# Patient Record
Sex: Male | Born: 1965
Health system: Southern US, Community
[De-identification: ages and names within clinical notes are randomized; demographics above are authoritative.]

## PROBLEM LIST (undated history)

## (undated) DIAGNOSIS — S81802A Unspecified open wound, left lower leg, initial encounter: Secondary | ICD-10-CM

## (undated) DIAGNOSIS — G4730 Sleep apnea, unspecified: Secondary | ICD-10-CM

## (undated) DIAGNOSIS — K219 Gastro-esophageal reflux disease without esophagitis: Secondary | ICD-10-CM

## (undated) DIAGNOSIS — S2239XA Fracture of one rib, unspecified side, initial encounter for closed fracture: Secondary | ICD-10-CM

## (undated) DIAGNOSIS — Z889 Allergy status to unspecified drugs, medicaments and biological substances status: Secondary | ICD-10-CM

## (undated) DIAGNOSIS — N400 Enlarged prostate without lower urinary tract symptoms: Secondary | ICD-10-CM

## (undated) HISTORY — PX: APPENDECTOMY: SHX54

## (undated) HISTORY — DX: Sleep apnea, unspecified: G47.30

---

## 1998-05-30 ENCOUNTER — Other Ambulatory Visit: Admission: RE | Admit: 1998-05-30 | Discharge: 1998-05-30 | Payer: Self-pay | Admitting: Obstetrics and Gynecology

## 1998-05-31 ENCOUNTER — Ambulatory Visit: Admission: RE | Admit: 1998-05-31 | Discharge: 1998-05-31 | Payer: Self-pay | Admitting: Otolaryngology

## 2007-11-15 ENCOUNTER — Emergency Department (HOSPITAL_COMMUNITY): Admission: EM | Admit: 2007-11-15 | Discharge: 2007-11-15 | Payer: Self-pay | Admitting: Emergency Medicine

## 2008-10-16 ENCOUNTER — Ambulatory Visit: Payer: Self-pay | Admitting: Diagnostic Radiology

## 2008-10-16 ENCOUNTER — Emergency Department (HOSPITAL_BASED_OUTPATIENT_CLINIC_OR_DEPARTMENT_OTHER): Admission: EM | Admit: 2008-10-16 | Discharge: 2008-10-16 | Payer: Self-pay | Admitting: Emergency Medicine

## 2008-10-18 ENCOUNTER — Emergency Department (HOSPITAL_BASED_OUTPATIENT_CLINIC_OR_DEPARTMENT_OTHER): Admission: EM | Admit: 2008-10-18 | Discharge: 2008-10-18 | Payer: Self-pay | Admitting: Internal Medicine

## 2008-11-06 ENCOUNTER — Encounter: Payer: Self-pay | Admitting: Pulmonary Disease

## 2009-08-13 ENCOUNTER — Ambulatory Visit: Payer: Self-pay | Admitting: Pulmonary Disease

## 2009-08-13 DIAGNOSIS — R0602 Shortness of breath: Secondary | ICD-10-CM

## 2009-08-13 DIAGNOSIS — G4733 Obstructive sleep apnea (adult) (pediatric): Secondary | ICD-10-CM

## 2009-08-13 DIAGNOSIS — T7840XA Allergy, unspecified, initial encounter: Secondary | ICD-10-CM | POA: Insufficient documentation

## 2009-08-20 ENCOUNTER — Ambulatory Visit: Payer: Self-pay | Admitting: Pulmonary Disease

## 2009-08-23 ENCOUNTER — Encounter: Payer: Self-pay | Admitting: Pulmonary Disease

## 2009-09-11 ENCOUNTER — Ambulatory Visit (HOSPITAL_COMMUNITY): Admission: RE | Admit: 2009-09-11 | Discharge: 2009-09-11 | Payer: Self-pay | Admitting: Pulmonary Disease

## 2011-04-12 ENCOUNTER — Emergency Department (HOSPITAL_BASED_OUTPATIENT_CLINIC_OR_DEPARTMENT_OTHER)
Admission: EM | Admit: 2011-04-12 | Discharge: 2011-04-12 | Disposition: A | Discharge status: Home / Self Care | Payer: 59 | Attending: Emergency Medicine | Admitting: Emergency Medicine

## 2011-04-12 DIAGNOSIS — S61209A Unspecified open wound of unspecified finger without damage to nail, initial encounter: Secondary | ICD-10-CM | POA: Insufficient documentation

## 2011-04-12 DIAGNOSIS — Y92009 Unspecified place in unspecified non-institutional (private) residence as the place of occurrence of the external cause: Secondary | ICD-10-CM | POA: Insufficient documentation

## 2011-04-12 DIAGNOSIS — W540XXA Bitten by dog, initial encounter: Secondary | ICD-10-CM | POA: Insufficient documentation

## 2011-08-15 LAB — DIFFERENTIAL
Basophils Absolute: 0.2 10*3/uL — ABNORMAL HIGH (ref 0.0–0.1)
Eosinophils Relative: 1 % (ref 0–5)
Lymphocytes Relative: 20 % (ref 12–46)
Lymphs Abs: 1.9 10*3/uL (ref 0.7–4.0)
Monocytes Absolute: 1.1 10*3/uL — ABNORMAL HIGH (ref 0.1–1.0)
Monocytes Relative: 11 % (ref 3–12)
Neutro Abs: 6.5 10*3/uL (ref 1.7–7.7)

## 2011-08-15 LAB — CBC
HCT: 40.6 % (ref 39.0–52.0)
Hemoglobin: 14 g/dL (ref 13.0–17.0)
RBC: 4.6 MIL/uL (ref 4.22–5.81)

## 2014-04-08 ENCOUNTER — Emergency Department (HOSPITAL_BASED_OUTPATIENT_CLINIC_OR_DEPARTMENT_OTHER)
Admission: EM | Admit: 2014-04-08 | Discharge: 2014-04-09 | Disposition: A | Discharge status: Home / Self Care | Payer: 59 | Attending: Emergency Medicine | Admitting: Emergency Medicine

## 2014-04-08 ENCOUNTER — Emergency Department (HOSPITAL_BASED_OUTPATIENT_CLINIC_OR_DEPARTMENT_OTHER): Payer: 59

## 2014-04-08 ENCOUNTER — Encounter (HOSPITAL_BASED_OUTPATIENT_CLINIC_OR_DEPARTMENT_OTHER): Payer: Self-pay | Admitting: Emergency Medicine

## 2014-04-08 DIAGNOSIS — W010XXA Fall on same level from slipping, tripping and stumbling without subsequent striking against object, initial encounter: Secondary | ICD-10-CM | POA: Insufficient documentation

## 2014-04-08 DIAGNOSIS — Y9289 Other specified places as the place of occurrence of the external cause: Secondary | ICD-10-CM | POA: Insufficient documentation

## 2014-04-08 DIAGNOSIS — L03119 Cellulitis of unspecified part of limb: Principal | ICD-10-CM

## 2014-04-08 DIAGNOSIS — L0390 Cellulitis, unspecified: Secondary | ICD-10-CM

## 2014-04-08 DIAGNOSIS — IMO0002 Reserved for concepts with insufficient information to code with codable children: Secondary | ICD-10-CM | POA: Insufficient documentation

## 2014-04-08 DIAGNOSIS — L02419 Cutaneous abscess of limb, unspecified: Secondary | ICD-10-CM | POA: Insufficient documentation

## 2014-04-08 DIAGNOSIS — Y9389 Activity, other specified: Secondary | ICD-10-CM | POA: Insufficient documentation

## 2014-04-08 LAB — CBC WITH DIFFERENTIAL/PLATELET
BASOS ABS: 0 10*3/uL (ref 0.0–0.1)
BASOS PCT: 1 % (ref 0–1)
EOS PCT: 1 % (ref 0–5)
Eosinophils Absolute: 0.1 10*3/uL (ref 0.0–0.7)
HCT: 42.1 % (ref 39.0–52.0)
Hemoglobin: 14.4 g/dL (ref 13.0–17.0)
LYMPHS PCT: 29 % (ref 12–46)
Lymphs Abs: 2.4 10*3/uL (ref 0.7–4.0)
MCH: 31.2 pg (ref 26.0–34.0)
MCHC: 34.2 g/dL (ref 30.0–36.0)
MCV: 91.1 fL (ref 78.0–100.0)
Monocytes Absolute: 0.8 10*3/uL (ref 0.1–1.0)
Monocytes Relative: 10 % (ref 3–12)
NEUTROS ABS: 4.9 10*3/uL (ref 1.7–7.7)
Neutrophils Relative %: 59 % (ref 43–77)
PLATELETS: 214 10*3/uL (ref 150–400)
RBC: 4.62 MIL/uL (ref 4.22–5.81)
RDW: 14.4 % (ref 11.5–15.5)
WBC: 8.2 10*3/uL (ref 4.0–10.5)

## 2014-04-08 LAB — BASIC METABOLIC PANEL
BUN: 18 mg/dL (ref 6–23)
CALCIUM: 9.3 mg/dL (ref 8.4–10.5)
CHLORIDE: 105 meq/L (ref 96–112)
CO2: 25 meq/L (ref 19–32)
Creatinine, Ser: 1 mg/dL (ref 0.50–1.35)
GFR calc non Af Amer: 87 mL/min — ABNORMAL LOW (ref >90)
Glucose, Bld: 95 mg/dL (ref 70–99)
Potassium: 3.8 mEq/L (ref 3.7–5.3)
SODIUM: 143 meq/L (ref 137–147)

## 2014-04-08 LAB — CBG MONITORING, ED: GLUCOSE-CAPILLARY: 83 mg/dL (ref 70–99)

## 2014-04-08 LAB — D-DIMER, QUANTITATIVE (NOT AT ARMC): D DIMER QUANT: 0.37 ug{FEU}/mL (ref 0.00–0.48)

## 2014-04-08 MED ORDER — CEPHALEXIN 500 MG PO CAPS: 500.0000 mg | ORAL_CAPSULE | Freq: Four times a day (QID) | ORAL | Status: DC

## 2014-04-08 MED ORDER — VANCOMYCIN HCL IN DEXTROSE 1-5 GM/200ML-% IV SOLN
1000.0000 mg | Freq: Once | INTRAVENOUS | Status: AC
  Administered 2014-04-08: 1000 mg via INTRAVENOUS
  Filled 2014-04-08: qty 200

## 2014-04-08 MED ORDER — SULFAMETHOXAZOLE-TRIMETHOPRIM 800-160 MG PO TABS: 1.0000 | ORAL_TABLET | Freq: Two times a day (BID) | ORAL | Status: DC

## 2014-04-08 MED ORDER — HYDROCODONE-ACETAMINOPHEN 5-325 MG PO TABS: 2.0000 | ORAL_TABLET | ORAL | Status: DC | PRN

## 2014-04-08 MED ORDER — ENOXAPARIN SODIUM 120 MG/0.8ML ~~LOC~~ SOLN
120.0000 mg | Freq: Once | SUBCUTANEOUS | Status: AC
  Administered 2014-04-08: 120 mg via SUBCUTANEOUS
  Filled 2014-04-08: qty 0.8

## 2014-04-08 NOTE — Discharge Instructions (Signed)
Cellulitis Take antibiotics and pain medication as prescribed. Return tomorrow for ultrasound of your leg to rule out a blood clot. Your leg should be evaluated again in 48 hours or sooner if the redness extends beyond the marked borders. Return to the ED if you develop new or worsening symptoms. Cellulitis is an infection of the skin and the tissue beneath it. The infected area is usually red and tender. Cellulitis occurs most often in the arms and lower legs.  CAUSES  Cellulitis is caused by bacteria that enter the skin through cracks or cuts in the skin. The most common types of bacteria that cause cellulitis are Staphylococcus and Streptococcus. SYMPTOMS   Redness and warmth.  Swelling.  Tenderness or pain.  Fever. DIAGNOSIS  Your caregiver can usually determine what is wrong based on a physical exam. Blood tests may also be done. TREATMENT  Treatment usually involves taking an antibiotic medicine. HOME CARE INSTRUCTIONS   Take your antibiotics as directed. Finish them even if you start to feel better.  Keep the infected arm or leg elevated to reduce swelling.  Apply a warm cloth to the affected area up to 4 times per day to relieve pain.  Only take over-the-counter or prescription medicines for pain, discomfort, or fever as directed by your caregiver.  Keep all follow-up appointments as directed by your caregiver. SEEK MEDICAL CARE IF:   You notice red streaks coming from the infected area.  Your red area gets larger or turns dark in color.  Your bone or joint underneath the infected area becomes painful after the skin has healed.  Your infection returns in the same area or another area.  You notice a swollen bump in the infected area.  You develop new symptoms. SEEK IMMEDIATE MEDICAL CARE IF:   You have a fever.  You feel very sleepy.  You develop vomiting or diarrhea.  You have a general ill feeling (malaise) with muscle aches and pains. MAKE SURE YOU:    Understand these instructions.  Will watch your condition.  Will get help right away if you are not doing well or get worse. Document Released: 08/06/2005 Document Revised: 04/27/2012 Document Reviewed: 01/12/2012 [facility] Patient Information 2014 [place], [place].

## 2014-04-08 NOTE — ED Notes (Signed)
CBG results were 83 mg./dcltr.

## 2014-04-08 NOTE — ED Provider Notes (Signed)
CSN: [id number]     Arrival date & time 04/08/14  1918 History  This chart was scribed for Glynn Octave, MD by Shari Heritage, ED Scribe. The patient was seen in room MH11/MH11. Patient's care was started at 7:56 PM.  Chief Complaint  Patient presents with  . Leg Injury  . Fall    The history is provided by the patient. No language interpreter was used.    HPI Comments: Jorge Strong is a 48 y.o. male who presents to the Emergency Department complaining of a leg injury resulting from a fall that occurred 3 days ago. Patient states that he was going down the steps of his back deck, when he slipped and hit his shin against one of the steps. He further reports that he has been on a hiking trip for the past 2 days, and today he started noticing blisters and increased redness to his leg. He states that he also scraped the same area of his leg 1 week ago with a 2x4 and has a prior history of injury several years ago that has caused chronic erythema to his right anterior shin. He feels he has developed additional bruising and redness in the last couple of days. Currently, he is complaining of right anterior lateral lower leg pain that worsened today after hiking. Pain is worse with ambulation and palpation of the leg. He denies dysuria, hematuria, chest pain, abdominal pain, fever, chills, nausea, vomiting, diarrhea or other symptoms at this time. He has no history of DM. Last Tetanus was 2 years ago.   History reviewed. No pertinent past medical history. History reviewed. No pertinent past surgical history. History reviewed. No pertinent family history. History  Substance Use Topics  . Smoking status: Never Smoker   . Smokeless tobacco: Not on file  . Alcohol Use: No     Comment: socially    Review of Systems A complete 10 system review of systems was obtained and all systems are negative except as noted in the HPI and PMH.    Allergies  Review of patient's allergies indicates no known  allergies.  Home Medications   Prior to Admission medications   Medication Sig Start Date End Date Taking? Authorizing Provider  mometasone (NASONEX) 50 MCG/ACT nasal spray Place 2 sprays into the nose daily.   Yes Historical Provider, MD  cephALEXin (KEFLEX) 500 MG capsule Take 1 capsule (500 mg total) by mouth 4 (four) times daily. 04/08/14   Glynn Octave, MD  HYDROcodone-acetaminophen (NORCO/VICODIN) 5-325 MG per tablet Take 2 tablets by mouth every 4 (four) hours as needed. 04/08/14   Glynn Octave, MD  sulfamethoxazole-trimethoprim (SEPTRA DS) 800-160 MG per tablet Take 1 tablet by mouth 2 (two) times daily. 04/08/14   Glynn Octave, MD   Triage Vitals: BP 146/87  Pulse 100  Temp(Src) 99.3 F (37.4 C) (Oral)  Resp 20  SpO2 99% Physical Exam  Nursing note and vitals reviewed. Constitutional: He is oriented to person, place, and time. He appears well-developed and well-nourished.  HENT:  Head: Normocephalic and atraumatic.  Eyes: Conjunctivae and EOM are normal.  Neck: Normal range of motion.  Cardiovascular: Normal rate, regular rhythm and normal heart sounds.  Exam reveals no gallop and no friction rub.   No murmur heard. Pulmonary/Chest: Effort normal and breath sounds normal. No respiratory distress. He has no wheezes. He has no rales.  Abdominal: Soft. There is no tenderness. There is no rebound and no guarding.  Musculoskeletal: Normal range of motion. He exhibits  edema and tenderness.  2 cm clean base ulceration to the right lateral lower leg. 4 cm of erythema nearly circumferential. Two tender fluid filled vesicles as seen in picture.  +2 DP and PT pulses, compartments soft.    Neurological: He is alert and oriented to person, place, and time. No cranial nerve deficit. He exhibits normal muscle tone.  Skin: Skin is warm and dry.  Psychiatric: He has a normal mood and affect. His behavior is normal.       ED Course  Procedures (including critical care  time) DIAGNOSTIC STUDIES: Oxygen Saturation is 99% on room air, normal by my interpretation.    COORDINATION OF CARE: 8:15 PM- Patient presents with ulceration consistent with cellulitis. Performed bedside ultrasound which shows cellulitis of right lower leg without abscess. Will order x-ray of right tibia/fibula. Patient informed of current plan for treatment and evaluation and agrees with plan at this time.    EMERGENCY DEPARTMENT US SOFT TISSUE INTERPRETATION "Study: Limited Ultrasound of the noted body part in comments below"  INDICATIONS: Soft tissue infection Multiple views of the body part are obtained with a multi-frequency linear probe  PERFORMED BY:  Myself  IMAGES ARCHIVED?: Yes  SIDE:Right   BODY PART:Lower extremity  FINDINGS: No abcess noted and Cellulitis present  LIMITATIONS: none  INTERPRETATION:  No abcess noted and Cellulitis present  COMMENT:  none    Labs Review Labs Reviewed  BASIC METABOLIC PANEL - Abnormal; Notable for the following:    GFR calc non Af Amer 87 (*)    All other components within normal limits  CBC WITH DIFFERENTIAL  D-DIMER, QUANTITATIVE  CBG MONITORING, ED    Imaging Review Dg Tibia/fibula Right  04/08/2014   CLINICAL DATA:  Leg injury related to fall  EXAM: RIGHT TIBIA AND FIBULA - 2 VIEW  COMPARISON:  10/18/2008  FINDINGS: Chronic or recurrent subcutaneous reticulation of the lower shin and lower calf. No fracture or malalignment.  IMPRESSION: No acute osseous injury.   Electronically Signed   By: Tiburcio PeaJonathan  Watts M.D.   On: 04/08/2014 20:53     EKG Interpretation None      MDM   Final diagnoses:  Cellulitis   Redness and pain to his right lower leg for the past 3 days with worsening pain and swelling. No fever or vomiting. No chest pain or shortness of breath. Exam consistent with cellulitis. Bedside ultrasound does not showing a drainable abscess. Tetanus is up-to-date. Patient given first dose of vancomycin in ED.  He is afebrile with normal white count.  D-dimer negative. Ultrasound is not available tonight. We'll dose Lovenox.  Treat for cellulitis with Bactrim and Keflex. First dose of vancomycin given in the ED. Wound edges marked. Patient instructed to recheck in 48 hours or sooner if redness extends beyond marked borders. Patient instructed needs followup with PCP in 48 hours or sooner if redness spreads again marked borders. We'll need to return for ultrasound to rule out DVT.  BP 146/87  Pulse 100  Temp(Src) 99.3 F (37.4 C) (Oral)  Resp 20  SpO2 99%   I personally performed the services described in this documentation, which was scribed in my presence. The recorded information has been reviewed and is accurate.    Glynn OctaveStephen Mccade Sullenberger, MD 04/08/14 [phone]

## 2014-04-08 NOTE — ED Notes (Signed)
Pt reports that he fell on Wednesday and hit his shin into the railing on the deck.  Reports that he went on a hiking trip Friday and Saturday.  Swelling, discoloration and blistering noted to (R) lower shin.  Painful to touch.

## 2014-04-10 ENCOUNTER — Ambulatory Visit (HOSPITAL_BASED_OUTPATIENT_CLINIC_OR_DEPARTMENT_OTHER)
Admission: RE | Admit: 2014-04-10 | Discharge: 2014-04-10 | Disposition: A | Discharge status: Home / Self Care | Payer: 59 | Source: Ambulatory Visit | Attending: Emergency Medicine | Admitting: Emergency Medicine

## 2014-04-10 DIAGNOSIS — M79609 Pain in unspecified limb: Secondary | ICD-10-CM | POA: Insufficient documentation

## 2014-04-10 DIAGNOSIS — M7989 Other specified soft tissue disorders: Secondary | ICD-10-CM | POA: Insufficient documentation

## 2014-05-02 ENCOUNTER — Encounter (HOSPITAL_BASED_OUTPATIENT_CLINIC_OR_DEPARTMENT_OTHER): Payer: 59 | Attending: General Surgery

## 2014-05-02 DIAGNOSIS — E119 Type 2 diabetes mellitus without complications: Secondary | ICD-10-CM | POA: Insufficient documentation

## 2014-05-02 DIAGNOSIS — S91009A Unspecified open wound, unspecified ankle, initial encounter: Principal | ICD-10-CM

## 2014-05-02 DIAGNOSIS — S81009A Unspecified open wound, unspecified knee, initial encounter: Secondary | ICD-10-CM | POA: Insufficient documentation

## 2014-05-02 DIAGNOSIS — S81809A Unspecified open wound, unspecified lower leg, initial encounter: Principal | ICD-10-CM

## 2014-05-02 DIAGNOSIS — E669 Obesity, unspecified: Secondary | ICD-10-CM | POA: Insufficient documentation

## 2014-05-02 DIAGNOSIS — I10 Essential (primary) hypertension: Secondary | ICD-10-CM | POA: Insufficient documentation

## 2014-05-02 DIAGNOSIS — X58XXXA Exposure to other specified factors, initial encounter: Secondary | ICD-10-CM | POA: Insufficient documentation

## 2014-05-03 NOTE — Progress Notes (Signed)
[facility]  NAME:  Jorge LargeLMON, Jorge                 ACCOUNT NO.:  [id number]  MEDICAL RECORD NO.:  [id number]      DATE OF BIRTH:  11/21/1965  PHYSICIAN:  Ardath SaxPeter Parker, M.D.      VISIT DATE:  05/02/2014                                  OFFICE VISIT   This is a 48 year old obese diabetic male who comes to us because of a traumatic wound on the anterior aspect of his left leg.  It is about 2 cm in diameter.  This man also has a history of hypertension and type 2 diabetes and obesity.  We are planning to treat this with oral antibiotics, and we will also treat it with the collagen dressings and if need be later perhaps Apligraf.  He today was found to have a blood pressure of 130/80, pulse 80, temperature 98.  He does weigh at 290 pounds.  His medicines are Keflex 500 mg 4 times a day, Bactrim 800-160 twice a day.  He is also on Nasonex for his allergies.  So, we will treat him with the collagen and continue the antibiotics, and we will see him back in a week.  His diagnosis is traumatic wound, anterior aspect right leg, history of hypertension, history of obesity.     Ardath SaxPeter Parker, M.D.     PP/MEDQ  D:  05/02/2014  T:  05/02/2014  Job:  [id number]

## 2014-05-20 ENCOUNTER — Encounter: Payer: Self-pay | Admitting: Emergency Medicine

## 2014-05-20 ENCOUNTER — Emergency Department
Admission: EM | Admit: 2014-05-20 | Discharge: 2014-05-20 | Disposition: A | Discharge status: Home / Self Care | Payer: 59 | Source: Home / Self Care | Attending: Family Medicine | Admitting: Family Medicine

## 2014-05-20 DIAGNOSIS — S81811A Laceration without foreign body, right lower leg, initial encounter: Secondary | ICD-10-CM

## 2014-05-20 DIAGNOSIS — S91009A Unspecified open wound, unspecified ankle, initial encounter: Secondary | ICD-10-CM

## 2014-05-20 DIAGNOSIS — S81009A Unspecified open wound, unspecified knee, initial encounter: Secondary | ICD-10-CM

## 2014-05-20 DIAGNOSIS — S81809A Unspecified open wound, unspecified lower leg, initial encounter: Secondary | ICD-10-CM

## 2014-05-20 DIAGNOSIS — R609 Edema, unspecified: Secondary | ICD-10-CM

## 2014-05-20 NOTE — ED Notes (Signed)
Reports hiking injury earlier today; sharp tree root scraped/punctured right inner/lower leg. Tetanus current.

## 2014-05-20 NOTE — ED Provider Notes (Signed)
CSN: [id number]     Arrival date & time 05/20/14  1710 History   First MD Initiated Contact with Patient 05/20/14 1720     Chief Complaint  Patient presents with  . Leg Injury      HPI Comments: While hiking today, patient scraped his right lower leg on a branch resulting in skin tear.  He has had skin injuries and cellulitis to his right leg lower leg with slow and delayed healing.  His Tdap is curent.  Patient is a 48 y.o. male presenting with skin laceration. The history is provided by the patient.  Laceration Location:  Leg Leg laceration location:  R lower leg Length (cm):  5 Depth:  Through dermis Bleeding: controlled   Time since incident:  5 hours Injury mechanism: root. Pain details:    Quality:  Aching   Severity:  Mild   Progression:  Unchanged Foreign body present:  No foreign bodies Relieved by:  Pressure Worsened by:  Nothing tried Tetanus status:  Up to date   History reviewed. No pertinent past medical history. History reviewed. No pertinent past surgical history. History reviewed. No pertinent family history. History  Substance Use Topics  . Smoking status: Never Smoker   . Smokeless tobacco: Not on file  . Alcohol Use: No     Comment: socially    Review of Systems  All other systems reviewed and are negative.   Allergies  Review of patient's allergies indicates no known allergies.  Home Medications   Prior to Admission medications   Medication Sig Start Date End Date Taking? Authorizing Provider  cetirizine (ZYRTEC) 10 MG tablet Take 10 mg by mouth daily.   Yes Historical Provider, MD  cephALEXin (KEFLEX) 500 MG capsule Take 1 capsule (500 mg total) by mouth 4 (four) times daily. 04/08/14   Glynn OctaveStephen Rancour, MD  HYDROcodone-acetaminophen (NORCO/VICODIN) 5-325 MG per tablet Take 2 tablets by mouth every 4 (four) hours as needed. 04/08/14   Glynn OctaveStephen Rancour, MD  mometasone (NASONEX) 50 MCG/ACT nasal spray Place 2 sprays into the nose daily.     Historical Provider, MD  sulfamethoxazole-trimethoprim (SEPTRA DS) 800-160 MG per tablet Take 1 tablet by mouth 2 (two) times daily. 04/08/14   Glynn OctaveStephen Rancour, MD   BP 139/93  Pulse 83  Temp(Src) 98.2 F (36.8 C) (Oral)  Resp 16  Ht 6\' 5"  (1.956 m)  Wt 300 lb (136.079 kg)  BMI 35.57 kg/m2 Physical Exam  Nursing note and vitals reviewed. Constitutional: He is oriented to person, place, and time. He appears well-developed and well-nourished. No distress.  HENT:  Head: Normocephalic.  Eyes: Conjunctivae are normal. Pupils are equal, round, and reactive to light.  Musculoskeletal:       Right lower leg: He exhibits tenderness, edema and laceration. He exhibits no bony tenderness, no swelling and no deformity.       Legs: Right lower leg has a semi-circular flap laceration as noted on diagram with a total edge length of approximately 5cm.   Note multiple scars on right lower leg distally.  Both legs have trace edema, and both legs have dusky hyperpigmentation.    Neurological: He is alert and oriented to person, place, and time.  Skin: Skin is warm and dry.    ED Course  Procedures  Laceration Repair (Dermabond) Discussed benefits and risks of procedure and verbal consent obtained. Using sterile technique and local anesthesia with 1% lidocaine with epinephrine, cleansed wound with Betadine followed by copious lavage with normal saline.  Wound carefully inspected for debris and foreign bodies; none found.  Wound edges carefully approximated in normal anatomic position using 1/8inch butterfly bandages, then sealed with Dermabond.  Wound precautions explained to patient.        MDM   1. Skin tear of right lower leg without complication, initial encounter   2. Dependent edema    Applied ace wraps to right lower leg in graduated compression fashion.  Discussed Dermabond instructions: return for any signs of infection or follow-up with family doctor.  Elevate leg.   Recommend wearing  below-the-knee support hose daytime (light compression).   Lattie Haw, MD 05/25/14 [phone]

## 2014-05-25 NOTE — Discharge Instructions (Signed)
Discussed Dermabond instructions: return for any signs of infection or follow-up with family doctor.  Elevate leg.   Recommend wearing below-the-knee support hose daytime (light compression).   Laceration Care, Adult A laceration is a cut or lesion that goes through all layers of the skin and into the tissue just beneath the skin. TREATMENT  Some lacerations may not require closure. Some lacerations may not be able to be closed due to an increased risk of infection. It is important to see your caregiver as soon as possible after an injury to minimize the risk of infection and maximize the opportunity for successful closure. If closure is appropriate, pain medicines may be given, if needed. The wound will be cleaned to help prevent infection. Your caregiver will use stitches (sutures), staples, wound glue (adhesive), or skin adhesive strips to repair the laceration. These tools bring the skin edges together to allow for faster healing and a better cosmetic outcome. However, all wounds will heal with a scar. Once the wound has healed, scarring can be minimized by covering the wound with sunscreen during the day for 1 full year. HOME CARE INSTRUCTIONS  For sutures or staples:  Keep the wound clean and dry.  If you were given a bandage (dressing), you should change it at least once a day. Also, change the dressing if it becomes wet or dirty, or as directed by your caregiver.  Wash the wound with soap and water 2 times a day. Rinse the wound off with water to remove all soap. Pat the wound dry with a clean towel.  After cleaning, apply a thin layer of the antibiotic ointment as recommended by your caregiver. This will help prevent infection and keep the dressing from sticking.  You may shower as usual after the first 24 hours. Do not soak the wound in water until the sutures are removed.  Only take over-the-counter or prescription medicines for pain, discomfort, or fever as directed by your  caregiver.  Get your sutures or staples removed as directed by your caregiver. For skin adhesive strips:  Keep the wound clean and dry.  Do not get the skin adhesive strips wet. You may bathe carefully, using caution to keep the wound dry.  If the wound gets wet, pat it dry with a clean towel.  Skin adhesive strips will fall off on their own. You may trim the strips as the wound heals. Do not remove skin adhesive strips that are still stuck to the wound. They will fall off in time. For wound adhesive:  You may briefly wet your wound in the shower or bath. Do not soak or scrub the wound. Do not swim. Avoid periods of heavy perspiration until the skin adhesive has fallen off on its own. After showering or bathing, gently pat the wound dry with a clean towel.  Do not apply liquid medicine, cream medicine, or ointment medicine to your wound while the skin adhesive is in place. This may loosen the film before your wound is healed.  If a dressing is placed over the wound, be careful not to apply tape directly over the skin adhesive. This may cause the adhesive to be pulled off before the wound is healed.  Avoid prolonged exposure to sunlight or tanning lamps while the skin adhesive is in place. Exposure to ultraviolet light in the first year will darken the scar.  The skin adhesive will usually remain in place for 5 to 10 days, then naturally fall off the skin. Do not pick  at the adhesive film. You may need a tetanus shot if:  You cannot remember when you had your last tetanus shot.  You have never had a tetanus shot. If you get a tetanus shot, your arm may swell, get red, and feel warm to the touch. This is common and not a problem. If you need a tetanus shot and you choose not to have one, there is a rare chance of getting tetanus. Sickness from tetanus can be serious. SEEK MEDICAL CARE IF:   You have redness, swelling, or increasing pain in the wound.  You see a red line that goes away  from the wound.  You have yellowish-white fluid (pus) coming from the wound.  You have a fever.  You notice a bad smell coming from the wound or dressing.  Your wound breaks open before or after sutures have been removed.  You notice something coming out of the wound such as wood or glass.  Your wound is on your hand or foot and you cannot move a finger or toe. SEEK IMMEDIATE MEDICAL CARE IF:   Your pain is not controlled with prescribed medicine.  You have severe swelling around the wound causing pain and numbness or a change in color in your arm, hand, leg, or foot.  Your wound splits open and starts bleeding.  You have worsening numbness, weakness, or loss of function of any joint around or beyond the wound.  You develop painful lumps near the wound or on the skin anywhere on your body. MAKE SURE YOU:   Understand these instructions.  Will watch your condition.  Will get help right away if you are not doing well or get worse. Document Released: 10/27/2005 Document Revised: 01/19/2012 Document Reviewed: 04/22/2011 [facility] Patient Information 2015 [place], [place]. This information is not intended to replace advice given to you by your health care provider. Make sure you discuss any questions you have with your health care provider.

## 2014-05-26 ENCOUNTER — Telehealth: Payer: Self-pay

## 2014-05-26 NOTE — ED Notes (Signed)
I called and spoke with patient and he is doing better. I advised to call back if anything changes or if he has questions or concerns.  

## 2015-08-30 ENCOUNTER — Ambulatory Visit: Payer: Self-pay | Admitting: General Surgery

## 2015-08-30 NOTE — H&P (Signed)
History of Present Illness Jorge Strong(Lakea Mittelman MD; 08/30/2015 9:31 AM) Patient words: hernia.  The patient is a 49 year old male who presents with an inguinal hernia. The patient is a 49 year old male who is referred by Dr. Elias Elseobert Reade for evaluation of right inguinal hernia. Patient states is been there for several years. He states that it fluctuates as far as pain. He states when his weight fluctuates he does notice a bulge more to the right side as well as pain.  Patient has had no signs or symptoms of incarceration or strangulation.  The patient is very active and outdoors, canoeing, backpacking.   Past Surgical History [name](Sonya Bynum, CMA; 08/30/2015 9:04 AM) Appendectomy  Diagnostic Studies History [name](Sonya Bynum, CMA; 08/30/2015 9:04 AM) Colonoscopy never  Allergies (Sonya Bynum, CMA; 08/30/2015 9:04 AM) No Known Drug Allergies10/20/2016  Medication History (Sonya Bynum, CMA; 08/30/2015 9:05 AM) Nasonex (50MCG/ACT Suspension, Nasal) Active. ZyrTEC Allergy (10MG  Capsule, Oral) Active. Medications Reconciled  Social History [name](Sonya Bynum, CMA; 08/30/2015 9:04 AM) Alcohol use Occasional alcohol use. Caffeine use Carbonated beverages, Coffee. Illicit drug use Uses socially only. Tobacco use Never smoker.  Family History [name](Sonya Bynum, CMA; 08/30/2015 9:04 AM) Diabetes Mellitus Family Members In General. Hypertension Family Members In General.    Review of Systems [name](Sonya Bynum CMA; 08/30/2015 9:04 AM) General Not Present- Appetite Loss, Chills, Fatigue, Fever, Night Sweats, Weight Gain and Weight Loss. Skin Not Present- Change in Wart/Mole, Dryness, Hives, Jaundice, New Lesions, Non-Healing Wounds, Rash and Ulcer. HEENT Not Present- Earache, Hearing Loss, Hoarseness, Nose Bleed, Oral Ulcers, Ringing in the Ears, Seasonal Allergies, Sinus Pain, Sore Throat, Visual Disturbances, Wears glasses/contact lenses and Yellow Eyes. Respiratory Not Present- Bloody sputum,  Chronic Cough, Difficulty Breathing, Snoring and Wheezing. Breast Not Present- Breast Mass, Breast Pain, Nipple Discharge and Skin Changes. Cardiovascular Not Present- Chest Pain, Difficulty Breathing Lying Down, Leg Cramps, Palpitations, Rapid Heart Rate, Shortness of Breath and Swelling of Extremities. Gastrointestinal Not Present- Abdominal Pain, Bloating, Bloody Stool, Change in Bowel Habits, Chronic diarrhea, Constipation, Difficulty Swallowing, Excessive gas, Gets full quickly at meals, Hemorrhoids, Indigestion, Nausea, Rectal Pain and Vomiting. Male Genitourinary Not Present- Blood in Urine, Change in Urinary Stream, Frequency, Impotence, Nocturia, Painful Urination, Urgency and Urine Leakage. Musculoskeletal Not Present- Back Pain, Joint Pain, Joint Stiffness, Muscle Pain, Muscle Weakness and Swelling of Extremities. Neurological Not Present- Decreased Memory, Fainting, Headaches, Numbness, Seizures, Tingling, Tremor, Trouble walking and Weakness. Psychiatric Not Present- Anxiety, Bipolar, Change in Sleep Pattern, Depression, Fearful and Frequent crying. Endocrine Not Present- Cold Intolerance, Excessive Hunger, Hair Changes, Heat Intolerance, Hot flashes and New Diabetes. Hematology Not Present- Easy Bruising, Excessive bleeding, Gland problems, HIV and Persistent Infections.  Vitals (Sonya Bynum CMA; 08/30/2015 9:04 AM) 08/30/2015 9:04 AM Weight: 321 lb Height: 77in Body Surface Area: 2.73 m Body Mass Index: 38.06 kg/m  Pulse: 81 (Regular)  BP: 132/80 (Sitting, Left Arm, Standard)       Physical Exam Jorge Strong(Pegah Segel, MD; 08/30/2015 9:32 AM) General Mental Status-Alert. General Appearance-Consistent with stated age. Hydration-Well hydrated. Voice-Normal.  Head and Neck Head-normocephalic, atraumatic with no lesions or palpable masses. Trachea-midline.  Eye Eyeball - Bilateral-Extraocular movements intact. Sclera/Conjunctiva - Bilateral-No  scleral icterus.  Chest and Lung Exam Chest and lung exam reveals -quiet, even and easy respiratory effort with no use of accessory muscles. Inspection Chest Wall - Normal. Back - normal.  Cardiovascular Cardiovascular examination reveals -normal heart sounds, regular rate and rhythm with no murmurs.  Abdomen Inspection Skin - Scar - no surgical  scars. Hernias - Inguinal hernia - Right - Reducible. Palpation/Percussion Normal exam - Soft, Non Tender, No Rebound tenderness, No Rigidity (guarding) and No hepatosplenomegaly. Auscultation Normal exam - Bowel sounds normal.  Neurologic Neurologic evaluation reveals -alert and oriented x 3 with no impairment of recent or remote memory. Mental Status-Normal.  Musculoskeletal Normal Exam - Left-Upper Extremity Strength Normal and Lower Extremity Strength Normal. Normal Exam - Right-Upper Extremity Strength Normal, Lower Extremity Weakness.    Assessment & Plan Jorge Filler MD; 08/30/2015 9:31 AM) RIGHT INGUINAL HERNIA (K40.90) Impression: 49 year old male with a right inguinal hernia.  1. The patient will like to proceed to the operating room for laparoscopic right inguinal hernia repair.  2. I discussed with the patient the signs and symptoms of incarceration and strangulation and the need to proceed to the ER should they occur.  3. I discussed with the patient the risks and benefits of the procedure to include but not limited to: Infection, bleeding, damage to surrounding structures, possible need for further surgery, possible nerve pain, and possible recurrence. The patient was understanding and wishes to proceed.

## 2015-10-30 ENCOUNTER — Encounter (HOSPITAL_COMMUNITY)
Admission: RE | Admit: 2015-10-30 | Discharge: 2015-10-30 | Disposition: A | Discharge status: Home / Self Care | Payer: 59 | Source: Ambulatory Visit | Attending: General Surgery | Admitting: General Surgery

## 2015-10-30 ENCOUNTER — Encounter (HOSPITAL_COMMUNITY): Payer: Self-pay

## 2015-10-30 ENCOUNTER — Ambulatory Visit (HOSPITAL_COMMUNITY)
Admission: RE | Admit: 2015-10-30 | Discharge: 2015-10-30 | Disposition: A | Discharge status: Home / Self Care | Payer: 59 | Source: Ambulatory Visit | Attending: Anesthesiology | Admitting: Anesthesiology

## 2015-10-30 DIAGNOSIS — R0781 Pleurodynia: Secondary | ICD-10-CM | POA: Diagnosis not present

## 2015-10-30 HISTORY — DX: Unspecified open wound, left lower leg, initial encounter: S81.802A

## 2015-10-30 HISTORY — DX: Fracture of one rib, unspecified side, initial encounter for closed fracture: S22.39XA

## 2015-10-30 HISTORY — DX: Allergy status to unspecified drugs, medicaments and biological substances: Z88.9

## 2015-10-30 LAB — CBC
HEMATOCRIT: 45.8 % (ref 39.0–52.0)
HEMOGLOBIN: 14.5 g/dL (ref 13.0–17.0)
MCH: 29.5 pg (ref 26.0–34.0)
MCHC: 31.7 g/dL (ref 30.0–36.0)
MCV: 93.1 fL (ref 78.0–100.0)
Platelets: 246 10*3/uL (ref 150–400)
RBC: 4.92 MIL/uL (ref 4.22–5.81)
RDW: 13.5 % (ref 11.5–15.5)
WBC: 5.6 10*3/uL (ref 4.0–10.5)

## 2015-10-30 NOTE — Pre-Procedure Instructions (Signed)
10-30-15 CXR done today per Dr. Leta JunglingEwell "pt complaints of left ribcage pain after fall 3 days ago".

## 2015-10-30 NOTE — Patient Instructions (Signed)
20 Jorge BuntingJames K Acuna  10/30/2015   Your procedure is scheduled on:   11-02-2015 Friday  [facility] and [facility] to 3rd Floor- Short Stay Center. Arrive at     0800   AM .  (Limit 1 person with you).  Call this number if you have problems the morning of surgery: [phone]  Or Presurgical Testing [phone] days before.   For Living Will and/or Health Care Power Attorney Forms: please provide copy for your medical record,may bring AM of surgery(Forms should be already notarized -we do not provide this service).( No information preferred today and given).     Do not eat food/ or drink: After Midnight.      Take these medicines the morning of surgery with A SIP OF WATER-   (DO NOT TAKE ANY DIABETIC MEDS AM OF SURGERY) : Cetirizine. Nasonex Nasal spray.   Do not wear jewelry, make-up or nail polish.  Do not wear deodorant, lotions, powders, or perfumes.   Do not shave legs and under arms- 48 hours(2 days) prior to first CHG shower.(Shaving face and neck okay.)  Do not bring valuables to the hospital.(Hospital is not responsible for lost valuables).  Contacts, dentures or removable bridgework, body piercing, hair pins may not be worn into surgery.  Leave suitcase in the car. After surgery it may be brought to your room.  For patients admitted to the hospital, checkout time is 11:00 AM the day of discharge.(Restricted visitors-Any Persons displaying flu-like symptoms or illness).    Patients discharged the day of surgery will not be allowed to drive home. Must have responsible person with you x 24 hours once discharged.  Name and phone number of your driver: Judeth CornfieldStephanie- spouse [phone]      Please read over the following fact sheets that you were given:  CHG(Chlorhexidine Gluconate 4% Surgical Soap) use.         [facility] - Preparing for Surgery Before surgery, you can play an important role.  Because skin is not sterile, your  skin needs to be as free of germs as possible.  You can reduce the number of germs on your skin by washing with CHG (chlorahexidine gluconate) soap before surgery.  CHG is an antiseptic cleaner which kills germs and bonds with the skin to continue killing germs even after washing. Please DO NOT use if you have an allergy to CHG or antibacterial soaps.  If your skin becomes reddened/irritated stop using the CHG and inform your nurse when you arrive at Short Stay. Do not shave (including legs and underarms) for at least 48 hours prior to the first CHG shower.  You may shave your face/neck. Please follow these instructions carefully:  1.  Shower with CHG Soap the night before surgery and the  morning of Surgery.  2.  If you choose to wash your hair, wash your hair first as usual with your  normal  shampoo.  3.  After you shampoo, rinse your hair and body thoroughly to remove the  shampoo.                           4.  Use CHG as you would any other liquid soap.  You can apply chg directly  to the skin and wash                       Gently with  a scrungie or clean washcloth.  5.  Apply the CHG Soap to your body ONLY FROM THE NECK DOWN.   Do not use on face/ open                           Wound or open sores. Avoid contact with eyes, ears mouth and genitals (private parts).                       Wash face,  Genitals (private parts) with your normal soap.             6.  Wash thoroughly, paying special attention to the area where your surgery  will be performed.  7.  Thoroughly rinse your body with warm water from the neck down.  8.  DO NOT shower/wash with your normal soap after using and rinsing off  the CHG Soap.                9.  Pat yourself dry with a clean towel.            10.  Wear clean pajamas.            11.  Place clean sheets on your bed the night of your first shower and do not  sleep with pets. Day of Surgery : Do not apply any lotions/deodorants the morning of surgery.  Please wear  clean clothes to the hospital/surgery center.  FAILURE TO FOLLOW THESE INSTRUCTIONS MAY RESULT IN THE CANCELLATION OF YOUR SURGERY PATIENT SIGNATURE_________________________________  NURSE SIGNATURE__________________________________  ________________________________________________________________________

## 2015-10-31 NOTE — Progress Notes (Signed)
10-31-15 1005 Pt. Notified of surgery time changed to 1100 AM and to arrive to Short Stay at 0900 AM- other instructions remain unchanged. Voices understanding.

## 2015-11-01 MED ORDER — DEXTROSE 5 % IV SOLN
3.0000 g | INTRAVENOUS | Status: AC
  Administered 2015-11-02: 3 g via INTRAVENOUS
  Filled 2015-11-01: qty 3000

## 2015-11-02 ENCOUNTER — Encounter (HOSPITAL_COMMUNITY)
Admission: RE | Disposition: A | Discharge status: Home / Self Care | Payer: Self-pay | Source: Ambulatory Visit | Attending: General Surgery

## 2015-11-02 ENCOUNTER — Ambulatory Visit (HOSPITAL_COMMUNITY): Payer: 59 | Admitting: Anesthesiology

## 2015-11-02 ENCOUNTER — Encounter (HOSPITAL_COMMUNITY): Payer: Self-pay | Admitting: *Deleted

## 2015-11-02 ENCOUNTER — Ambulatory Visit (HOSPITAL_COMMUNITY)
Admission: RE | Admit: 2015-11-02 | Discharge: 2015-11-02 | Disposition: A | Discharge status: Home / Self Care | Payer: 59 | Source: Ambulatory Visit | Attending: General Surgery | Admitting: General Surgery

## 2015-11-02 DIAGNOSIS — G4730 Sleep apnea, unspecified: Secondary | ICD-10-CM | POA: Diagnosis not present

## 2015-11-02 DIAGNOSIS — Z6841 Body Mass Index (BMI) 40.0 and over, adult: Secondary | ICD-10-CM | POA: Diagnosis not present

## 2015-11-02 DIAGNOSIS — K4090 Unilateral inguinal hernia, without obstruction or gangrene, not specified as recurrent: Secondary | ICD-10-CM | POA: Insufficient documentation

## 2015-11-02 DIAGNOSIS — Z79899 Other long term (current) drug therapy: Secondary | ICD-10-CM | POA: Diagnosis not present

## 2015-11-02 HISTORY — PX: INSERTION OF MESH: SHX5868

## 2015-11-02 HISTORY — PX: INGUINAL HERNIA REPAIR: SHX194

## 2015-11-02 SURGERY — REPAIR, HERNIA, INGUINAL, LAPAROSCOPIC
Anesthesia: General | Laterality: Right

## 2015-11-02 MED ORDER — OXYCODONE HCL 5 MG PO TABS: 5.0000 mg | ORAL_TABLET | ORAL | Status: DC | PRN

## 2015-11-02 MED ORDER — SUCCINYLCHOLINE CHLORIDE 20 MG/ML IJ SOLN
INTRAMUSCULAR | Status: DC | PRN
  Administered 2015-11-02: 140 mg via INTRAVENOUS

## 2015-11-02 MED ORDER — ATROPINE SULFATE 0.4 MG/ML IJ SOLN
INTRAMUSCULAR | Status: AC
  Filled 2015-11-02: qty 1

## 2015-11-02 MED ORDER — MIDAZOLAM HCL 5 MG/5ML IJ SOLN
INTRAMUSCULAR | Status: DC | PRN
  Administered 2015-11-02: 2 mg via INTRAVENOUS

## 2015-11-02 MED ORDER — ROCURONIUM BROMIDE 100 MG/10ML IV SOLN
INTRAVENOUS | Status: DC | PRN
  Administered 2015-11-02: 20 mg via INTRAVENOUS
  Administered 2015-11-02: 50 mg via INTRAVENOUS

## 2015-11-02 MED ORDER — FENTANYL CITRATE (PF) 250 MCG/5ML IJ SOLN
INTRAMUSCULAR | Status: AC
  Filled 2015-11-02: qty 5

## 2015-11-02 MED ORDER — ONDANSETRON HCL 4 MG/2ML IJ SOLN
INTRAMUSCULAR | Status: AC
  Filled 2015-11-02: qty 2

## 2015-11-02 MED ORDER — HYDROMORPHONE HCL 1 MG/ML IJ SOLN: 0.2500 mg | INTRAMUSCULAR | Status: DC | PRN

## 2015-11-02 MED ORDER — OXYCODONE-ACETAMINOPHEN 5-325 MG PO TABS: 1.0000 | ORAL_TABLET | ORAL | Status: DC | PRN

## 2015-11-02 MED ORDER — EPHEDRINE SULFATE 50 MG/ML IJ SOLN
INTRAMUSCULAR | Status: AC
  Filled 2015-11-02: qty 1

## 2015-11-02 MED ORDER — CHLORHEXIDINE GLUCONATE 4 % EX LIQD: 1.0000 "application " | Freq: Once | CUTANEOUS | Status: DC

## 2015-11-02 MED ORDER — BUPIVACAINE-EPINEPHRINE 0.25% -1:200000 IJ SOLN
INTRAMUSCULAR | Status: DC | PRN
  Administered 2015-11-02: 20 mL

## 2015-11-02 MED ORDER — ACETAMINOPHEN 650 MG RE SUPP
650.0000 mg | RECTAL | Status: DC | PRN
  Filled 2015-11-02: qty 1

## 2015-11-02 MED ORDER — PROPOFOL 10 MG/ML IV BOLUS
INTRAVENOUS | Status: AC
  Filled 2015-11-02: qty 20

## 2015-11-02 MED ORDER — ONDANSETRON HCL 4 MG/2ML IJ SOLN: 4.0000 mg | Freq: Four times a day (QID) | INTRAMUSCULAR | Status: DC | PRN

## 2015-11-02 MED ORDER — SUGAMMADEX SODIUM 500 MG/5ML IV SOLN
INTRAVENOUS | Status: AC
  Filled 2015-11-02: qty 5

## 2015-11-02 MED ORDER — SODIUM CHLORIDE 0.9 % IJ SOLN
INTRAMUSCULAR | Status: AC
  Filled 2015-11-02: qty 10

## 2015-11-02 MED ORDER — DEXAMETHASONE SODIUM PHOSPHATE 10 MG/ML IJ SOLN
INTRAMUSCULAR | Status: DC | PRN
  Administered 2015-11-02: 10 mg via INTRAVENOUS

## 2015-11-02 MED ORDER — SODIUM CHLORIDE 0.9 % IJ SOLN: 3.0000 mL | Freq: Two times a day (BID) | INTRAMUSCULAR | Status: DC

## 2015-11-02 MED ORDER — LIDOCAINE HCL (CARDIAC) 20 MG/ML IV SOLN
INTRAVENOUS | Status: AC
  Filled 2015-11-02: qty 5

## 2015-11-02 MED ORDER — FENTANYL CITRATE (PF) 100 MCG/2ML IJ SOLN
INTRAMUSCULAR | Status: DC | PRN
  Administered 2015-11-02 (×2): 100 ug via INTRAVENOUS
  Administered 2015-11-02: 50 ug via INTRAVENOUS

## 2015-11-02 MED ORDER — ACETAMINOPHEN 325 MG PO TABS: 650.0000 mg | ORAL_TABLET | ORAL | Status: DC | PRN

## 2015-11-02 MED ORDER — MIDAZOLAM HCL 2 MG/2ML IJ SOLN
INTRAMUSCULAR | Status: AC
  Filled 2015-11-02: qty 2

## 2015-11-02 MED ORDER — LIDOCAINE HCL (PF) 2 % IJ SOLN
INTRAMUSCULAR | Status: DC | PRN
  Administered 2015-11-02: 100 mg via INTRADERMAL

## 2015-11-02 MED ORDER — PROPOFOL 10 MG/ML IV BOLUS
INTRAVENOUS | Status: DC | PRN
  Administered 2015-11-02: 300 mg via INTRAVENOUS

## 2015-11-02 MED ORDER — BUPIVACAINE-EPINEPHRINE (PF) 0.25% -1:200000 IJ SOLN
INTRAMUSCULAR | Status: AC
  Filled 2015-11-02: qty 30

## 2015-11-02 MED ORDER — SODIUM CHLORIDE 0.9 % IJ SOLN: 3.0000 mL | INTRAMUSCULAR | Status: DC | PRN

## 2015-11-02 MED ORDER — ONDANSETRON HCL 4 MG/2ML IJ SOLN
INTRAMUSCULAR | Status: DC | PRN
  Administered 2015-11-02: 4 mg via INTRAVENOUS

## 2015-11-02 MED ORDER — OXYCODONE HCL 5 MG PO TABS: 5.0000 mg | ORAL_TABLET | Freq: Once | ORAL | Status: DC | PRN

## 2015-11-02 MED ORDER — ROCURONIUM BROMIDE 100 MG/10ML IV SOLN
INTRAVENOUS | Status: AC
  Filled 2015-11-02: qty 1

## 2015-11-02 MED ORDER — LACTATED RINGERS IV SOLN
INTRAVENOUS | Status: DC
  Administered 2015-11-02: 1000 mL via INTRAVENOUS

## 2015-11-02 MED ORDER — SUGAMMADEX SODIUM 500 MG/5ML IV SOLN
INTRAVENOUS | Status: DC | PRN
Start: 2015-11-02 — End: 2015-11-02
  Administered 2015-11-02: 500 mg via INTRAVENOUS

## 2015-11-02 MED ORDER — MORPHINE SULFATE (PF) 10 MG/ML IV SOLN: 2.0000 mg | INTRAVENOUS | Status: DC | PRN

## 2015-11-02 MED ORDER — OXYCODONE HCL 5 MG/5ML PO SOLN
5.0000 mg | Freq: Once | ORAL | Status: DC | PRN
  Filled 2015-11-02: qty 5

## 2015-11-02 MED ORDER — DEXAMETHASONE SODIUM PHOSPHATE 10 MG/ML IJ SOLN
INTRAMUSCULAR | Status: AC
  Filled 2015-11-02: qty 1

## 2015-11-02 MED ORDER — SODIUM CHLORIDE 0.9 % IV SOLN: 250.0000 mL | INTRAVENOUS | Status: DC | PRN

## 2015-11-02 SURGICAL SUPPLY — 32 items
BAG URINE DRAINAGE (UROLOGICAL SUPPLIES) ×3 IMPLANT
BENZOIN TINCTURE PRP APPL 2/3 (GAUZE/BANDAGES/DRESSINGS) ×3 IMPLANT
CABLE HIGH FREQUENCY MONO STRZ (ELECTRODE) ×3 IMPLANT
CATH FOLEY 3WAY 30CC 16FR (CATHETERS) ×6 IMPLANT
CHLORAPREP W/TINT 26ML (MISCELLANEOUS) ×3 IMPLANT
CLOSURE WOUND 1/2 X4 (GAUZE/BANDAGES/DRESSINGS) ×1
COVER SURGICAL LIGHT HANDLE (MISCELLANEOUS) ×3 IMPLANT
DECANTER SPIKE VIAL GLASS SM (MISCELLANEOUS) ×3 IMPLANT
DRAPE LAPAROSCOPIC ABDOMINAL (DRAPES) ×3 IMPLANT
ELECT REM PT RETURN 9FT ADLT (ELECTROSURGICAL) ×3
ELECTRODE REM PT RTRN 9FT ADLT (ELECTROSURGICAL) ×1 IMPLANT
GAUZE SPONGE 2X2 8PLY STRL LF (GAUZE/BANDAGES/DRESSINGS) ×1 IMPLANT
GLOVE BIO SURGEON STRL SZ7.5 (GLOVE) ×3 IMPLANT
GOWN STRL REUS W/TWL XL LVL3 (GOWN DISPOSABLE) ×6 IMPLANT
KIT BASIN OR (CUSTOM PROCEDURE TRAY) ×3 IMPLANT
MESH 3DMAX 4X6 RT LRG (Mesh General) ×3 IMPLANT
RELOAD STAPLE HERNIA 4.0 BLUE (INSTRUMENTS) ×3 IMPLANT
RELOAD STAPLE HERNIA 4.8 BLK (STAPLE) ×3 IMPLANT
SCISSORS LAP 5X35 DISP (ENDOMECHANICALS) ×3 IMPLANT
SET IRRIG TUBING LAPAROSCOPIC (IRRIGATION / IRRIGATOR) IMPLANT
SET IRRIG Y TYPE TUR BLADDER L (SET/KITS/TRAYS/PACK) ×3 IMPLANT
SPONGE GAUZE 2X2 STER 10/PKG (GAUZE/BANDAGES/DRESSINGS) ×2
STAPLER HERNIA 12 8.5 360D (INSTRUMENTS) ×3 IMPLANT
STRIP CLOSURE SKIN 1/2X4 (GAUZE/BANDAGES/DRESSINGS) ×2 IMPLANT
SUT MNCRL AB 4-0 PS2 18 (SUTURE) ×3 IMPLANT
TOWEL OR 17X26 10 PK STRL BLUE (TOWEL DISPOSABLE) ×3 IMPLANT
TOWEL OR NON WOVEN STRL DISP B (DISPOSABLE) ×3 IMPLANT
TRAY FOLEY W/METER SILVER 14FR (SET/KITS/TRAYS/PACK) IMPLANT
TRAY FOLEY W/METER SILVER 16FR (SET/KITS/TRAYS/PACK) IMPLANT
TRAY LAPAROSCOPIC (CUSTOM PROCEDURE TRAY) ×3 IMPLANT
TROCAR CANNULA W/PORT DUAL 5MM (MISCELLANEOUS) ×3 IMPLANT
TROCAR XCEL 12X100 BLDLESS (ENDOMECHANICALS) ×3 IMPLANT

## 2015-11-02 NOTE — H&P (Signed)
History of Present Illness Axel Filler MD; 08/30/2015 9:31 AM) Patient words: hernia.  The patient is a 49 year old male who presents with an inguinal hernia. The patient is a 49 year old male who is referred by Dr. Elias Else for evaluation of right inguinal hernia. Patient states is been there for several years. He states that it fluctuates as far as pain. He states when his weight fluctuates he does notice a bulge more to the right side as well as pain.  Patient has had no signs or symptoms of incarceration or strangulation.  The patient is very active and outdoors, canoeing, backpacking.   Past Surgical History [name], CMA; 08/30/2015 9:04 AM) Appendectomy  Diagnostic Studies History [name], CMA; 08/30/2015 9:04 AM) Colonoscopy never  Allergies (Sonya Bynum, CMA; 08/30/2015 9:04 AM) No Known Drug Allergies10/20/2016  Medication History (Sonya Bynum, CMA; 08/30/2015 9:05 AM) Nasonex (50MCG/ACT Suspension, Nasal) Active. ZyrTEC Allergy (  Capsule, Oral) Active. Medications Reconciled  Social History [name], CMA; 08/30/2015 9:04 AM) Alcohol use Occasional alcohol use. Caffeine use Carbonated beverages, Coffee. Illicit drug use Uses socially only. Tobacco use Never smoker.  Family History [name], CMA; 08/30/2015 9:04 AM) Diabetes Mellitus Family Members In General. Hypertension Family Members In General.    Review of Systems [name] Bynum CMA; 08/30/2015 9:04 AM) General Not Present- Appetite Loss, Chills, Fatigue, Fever, Night Sweats, Weight Gain and Weight Loss. Skin Not Present- Change in Wart/Mole, Dryness, Hives, Jaundice, New Lesions, Non-Healing Wounds, Rash and Ulcer. HEENT Not Present- Earache, Hearing Loss, Hoarseness, Nose Bleed, Oral Ulcers, Ringing in the Ears, Seasonal Allergies, Sinus Pain, Sore Throat, Visual Disturbances, Wears glasses/contact lenses and Yellow Eyes. Respiratory Not Present- Bloody sputum,  Chronic Cough, Difficulty Breathing, Snoring and Wheezing. Breast Not Present- Breast Mass, Breast Pain, Nipple Discharge and Skin Changes. Cardiovascular Not Present- Chest Pain, Difficulty Breathing Lying Down, Leg Cramps, Palpitations, Rapid Heart Rate, Shortness of Breath and Swelling of Extremities. Gastrointestinal Not Present- Abdominal Pain, Bloating, Bloody Stool, Change in Bowel Habits, Chronic diarrhea, Constipation, Difficulty Swallowing, Excessive gas, Gets full quickly at meals, Hemorrhoids, Indigestion, Nausea, Rectal Pain and Vomiting. Male Genitourinary Not Present- Blood in Urine, Change in Urinary Stream, Frequency, Impotence, Nocturia, Painful Urination, Urgency and Urine Leakage. Musculoskeletal Not Present- Back Pain, Joint Pain, Joint Stiffness, Muscle Pain, Muscle Weakness and Swelling of Extremities. Neurological Not Present- Decreased Memory, Fainting, Headaches, Numbness, Seizures, Tingling, Tremor, Trouble walking and Weakness. Psychiatric Not Present- Anxiety, Bipolar, Change in Sleep Pattern, Depression, Fearful and Frequent crying. Endocrine Not Present- Cold Intolerance, Excessive Hunger, Hair Changes, Heat Intolerance, Hot flashes and New Diabetes. Hematology Not Present- Easy Bruising, Excessive bleeding, Gland problems, HIV and Persistent Infections.  BP 136/81 mmHg  Pulse 93  Temp(Src) 97.9 F (36.6 C) (Oral)  Resp 20  Ht  (1.956 m)  Wt 158.759 kg (350 lb)  BMI 41.50 kg/m2  SpO2 98%   Physical Exam Axel Filler, MD; 08/30/2015 9:32 AM) General Mental Status-Alert. General Appearance-Consistent with stated age. Hydration-Well hydrated. Voice-Normal.  Head and Neck Head-normocephalic, atraumatic with no lesions or palpable masses. Trachea-midline.  Eye Eyeball - Bilateral-Extraocular movements intact. Sclera/Conjunctiva - Bilateral-No scleral icterus.  Chest and Lung Exam Chest and lung exam reveals -quiet, even and  easy respiratory effort with no use of accessory muscles. Inspection Chest Wall - Normal. Back - normal.  Cardiovascular Cardiovascular examination reveals -normal heart sounds, regular rate and rhythm with no murmurs.  Abdomen Inspection Skin - Scar - no surgical scars. Hernias - Inguinal hernia -  Right - Reducible. Palpation/Percussion Normal exam - Soft, Non Tender, No Rebound tenderness, No Rigidity (guarding) and No hepatosplenomegaly. Auscultation Normal exam - Bowel sounds normal.  Neurologic Neurologic evaluation reveals -alert and oriented x 3 with no impairment of recent or remote memory. Mental Status-Normal.  Musculoskeletal Normal Exam - Left-Upper Extremity Strength Normal and Lower Extremity Strength Normal. Normal Exam - Right-Upper Extremity Strength Normal, Lower Extremity Weakness.    Assessment & Plan Axel Filler(Orien Mayhall MD; 08/30/2015 9:31 AM) RIGHT INGUINAL HERNIA (K40.90) Impression: 49 year old male with a right inguinal hernia.  1. The patient will like to proceed to the operating room for laparoscopic right inguinal hernia repair.  2. I discussed with the patient the risks and benefits of the procedure to include but not limited to: Infection, bleeding, damage to surrounding structures, possible need for further surgery, possible nerve pain, and possible recurrence. The patient was understanding and wishes to proceed.

## 2015-11-02 NOTE — Anesthesia Preprocedure Evaluation (Signed)
Anesthesia Evaluation  Patient identified by MRN, date of birth, ID band Patient awake    Reviewed: Allergy & Precautions, NPO status , Patient's Chart, lab work & pertinent test results  Airway Mallampati: II   Neck ROM: full    Dental   Pulmonary shortness of breath, sleep apnea ,    breath sounds clear to auscultation       Cardiovascular negative cardio ROS   Rhythm:regular Rate:Normal     Neuro/Psych    GI/Hepatic   Endo/Other  Morbid obesity  Renal/GU      Musculoskeletal   Abdominal   Peds  Hematology   Anesthesia Other Findings   Reproductive/Obstetrics                             Anesthesia Physical Anesthesia Plan  ASA: II  Anesthesia Plan: General   Post-op Pain Management:    Induction: Intravenous  Airway Management Planned: Oral ETT  Additional Equipment:   Intra-op Plan:   Post-operative Plan: Extubation in OR  Informed Consent: I have reviewed the patients History and Physical, chart, labs and discussed the procedure including the risks, benefits and alternatives for the proposed anesthesia with the patient or authorized representative who has indicated his/her understanding and acceptance.     Plan Discussed with: CRNA, Anesthesiologist and Surgeon  Anesthesia Plan Comments:         Anesthesia Quick Evaluation

## 2015-11-02 NOTE — Op Note (Signed)
11/02/2015  11:50 AM  PATIENT:  Rob BuntingJames K Faubert  49 y.o. male  PRE-OPERATIVE DIAGNOSIS:  RIGHT INGUINAL HERNIA   POST-OPERATIVE DIAGNOSIS:  RIGHT INDIRECT INGUINAL HERNIA   PROCEDURE:  Procedure(s): LAPAROSCOPIC RIGHT INGUINAL HERNIA REPAIR WITH MESH (Right) INSERTION OF MESH (Right)  SURGEON:  Surgeon(s) and Role:    * Axel FillerArmando Haaris Metallo, MD - Primary  ANESTHESIA:   local and general  EBL:   <5CC  BLOOD ADMINISTERED:none  DRAINS: none   LOCAL MEDICATIONS USED:  BUPIVICAINE   SPECIMEN:  No Specimen  DISPOSITION OF SPECIMEN:  N/A  COUNTS:  YES  TOURNIQUET:  * No tourniquets in log *  DICTATION: .Dragon Dictation   Counts: reported as correct x 2  Findings:  The patient had a small right indirect hernia  Indications for procedure:  The patient is a 49 year old male with a right inguinal hernia for several months. Patient complained of symptomatology to his right inguinal area. The patient was taken back for elective inguinal hernia repair.  Details of the procedure: The patient was taken back to the operating room. The patient was placed in supine position with bilateral SCDs in place.  The patient was prepped and draped in the usual sterile fashion.  After appropriate anitbiotics were confirmed, a time-out was confirmed and all facts were verified.  0.25% Marcaine was used to infiltrate the umbilical area. A 11-blade was used to cut down the skin and blunt dissection was used to get the anterior fashion.  The anterior fascia was incised approximately 1 cm and the muscles were retracted laterally. Blunt dissection was then used to create a space in the preperitoneal area. At this time a 10 mm camera was then introduced into the space and advanced the pubic tubercle and a 12 mm trocar was placed over this and insufflation was started.  At this time and space was created from medial to laterally the preperitoneal space.  Cooper's ligament was initially cleaned off.  The hernia  sac was identified in the right indirect space, as well as a cord lipoma that was completely dissected from the hernia defect. Dissection of the hernia sac was undertaken the vas deferens was identified and protected in all parts of the case.   Once the hernia sac was taken down to approximately the umbilicus a Bard 3D Max mesh, size: Large, was  introduced into the preperitoneal space.  The mesh was brought over to cover the direct and indirect hernia spaces.  This was anchored into place and secured to Cooper's ligament with 4.370mm staples from a Coviden hernia stapler. It was anchored to the anterior abdominal wall with 4.8 mm staples. The hernia sac was seen lying posterior to the mesh. There was no staples placed laterally. The insufflation was evacuated and the peritoneum was seen posterior to the mesh. The trochars were removed. The anterior fascia was reapproximated using #1 Vicryl on a UR- 6.  Intra-abdominal air was evacuated and the Veress needle removed. The skin was reapproximated using 4-0 Monocryl subcuticular fashion the patient was awakened from general anesthesia and taken to recovery in stable condition.   PLAN OF CARE: Discharge to home after PACU  PATIENT DISPOSITION:  PACU - hemodynamically stable.   Delay start of Pharmacological VTE agent (>24hrs) due to surgical blood loss or risk of bleeding: not applicable

## 2015-11-02 NOTE — Discharge Instructions (Signed)
[facility] _______[facility] ° °INGUINAL HERNIA REPAIR: POST OP INSTRUCTIONS ° °Always review your discharge instruction sheet given to you by the facility where your surgery was performed. °IF YOU HAVE DISABILITY OR FAMILY LEAVE FORMS, YOU MUST BRING THEM TO THE OFFICE FOR PROCESSING.   °DO NOT GIVE THEM TO YOUR DOCTOR. ° °1. A  prescription for pain medication may be given to you upon discharge.  Take your pain medication as prescribed, if needed.  If narcotic pain medicine is not needed, then you may take acetaminophen (Tylenol) or ibuprofen (Advil) as needed. °2. Take your usually prescribed medications unless otherwise directed. °3. If you need a refill on your pain medication, please contact your pharmacy.  They will contact our office to request authorization. Prescriptions will not be filled after 5 pm or on week-ends. °4. You should follow a light diet the first 24 hours after arrival home, such as soup and crackers, etc.  Be sure to include lots of fluids daily.  Resume your normal diet the day after surgery. °5. Most patients will experience some swelling and bruising around the umbilicus or in the groin and scrotum.  Ice packs and reclining will help.  Swelling and bruising can take several days to resolve.  °6. It is common to experience some constipation if taking pain medication after surgery.  Increasing fluid intake and taking a stool softener (such as Colace) will usually help or prevent this problem from occurring.  A mild laxative (Milk of Magnesia or Miralax) should be taken according to package directions if there are no bowel movements after 48 hours. °7. Unless discharge instructions indicate otherwise, you may remove your bandages 24-48 hours after surgery, and you may shower at that time.  You may have steri-strips (small skin tapes) in place directly over the incision.  These strips should be left on the skin for 7-10 days.  If your surgeon used skin glue on the incision, you  may shower in 24 hours.  The glue will flake off over the next 2-3 weeks.  Any sutures or staples will be removed at the office during your follow-up visit. °8. ACTIVITIES:  You may resume regular (light) daily activities beginning the next day--such as daily self-care, walking, climbing stairs--gradually increasing activities as tolerated.  You may have sexual intercourse when it is comfortable.  Refrain from any heavy lifting or straining until approved by your doctor. °a. You may drive when you are no longer taking prescription pain medication, you can comfortably wear a seatbelt, and you can safely maneuver your car and apply brakes. °b. RETURN TO WORK:  __________________________________________________________ °9. You should see your doctor in the office for a follow-up appointment approximately 2-3 weeks after your surgery.  Make sure that you call for this appointment within a day or two after you arrive home to insure a convenient appointment time. °10. OTHER INSTRUCTIONS:  __________________________________________________________________________________________________________________________________________________________________________________________  °WHEN TO CALL YOUR DOCTOR: °1. Fever over 101.0 °2. Inability to urinate °3. Nausea and/or vomiting °4. Extreme swelling or bruising °5. Continued bleeding from incision. °6. Increased pain, redness, or drainage from the incision ° °The clinic staff is available to answer your questions during regular business hours.  Please don’t hesitate to call and ask to speak to one of the nurses for clinical concerns.  If you have a medical emergency, go to the nearest emergency room or call 911.  A surgeon from [facility] is always on call at the hospital ° ° °[address], [address], [place], [place]  [address] ? ° [address] [address], [place], [place]   [address] °[phone] ? [phone] ? FAX [phone] °Web site:  [email or web address] ° °

## 2015-11-02 NOTE — Transfer of Care (Signed)
Immediate Anesthesia Transfer of Care Note  Patient: Rob BuntingJames K Charter  Procedure(s) Performed: Procedure(s): LAPAROSCOPIC RIGHT INGUINAL HERNIA REPAIR WITH MESH (Right) INSERTION OF MESH (Right)  Patient Location: PACU  Anesthesia Type:General  Level of Consciousness: awake, alert , oriented and patient cooperative  Airway & Oxygen Therapy: Patient Spontanous Breathing and Patient connected to face mask oxygen  Post-op Assessment: Report given to RN, Post -op Vital signs reviewed and stable and Patient moving all extremities X 4  Post vital signs: Reviewed and stable  Last Vitals:  Filed Vitals:   11/02/15 0912  BP: 136/81  Pulse: 93  Temp: 36.6 C  Resp: 20    Complications: No apparent anesthesia complications

## 2015-11-02 NOTE — Anesthesia Procedure Notes (Signed)
Procedure Name: Intubation Date/Time: 11/02/2015 11:04 AM Performed by: Elyn PeersALLEN, Danilynn Jemison J Pre-anesthesia Checklist: Patient identified, Emergency Drugs available, Suction available, Patient being monitored and Timeout performed Patient Re-evaluated:Patient Re-evaluated prior to inductionOxygen Delivery Method: Circle system utilized Preoxygenation: Pre-oxygenation with 100% oxygen Intubation Type: IV induction Ventilation: Mask ventilation without difficulty and Oral airway inserted - appropriate to patient size Laryngoscope Size: Miller and 3 Grade View: Grade I Tube type: Oral Tube size: 8.0 mm Number of attempts: 1 Airway Equipment and Method: Stylet Placement Confirmation: ETT inserted through vocal cords under direct vision,  positive ETCO2 and breath sounds checked- equal and bilateral Secured at: 24 cm Tube secured with: Tape Dental Injury: Teeth and Oropharynx as per pre-operative assessment

## 2015-11-02 NOTE — Progress Notes (Signed)
Pt ambulated in hall and tolerated well.  Pt is without complaints.

## 2015-11-06 ENCOUNTER — Encounter (HOSPITAL_COMMUNITY): Payer: Self-pay | Admitting: General Surgery

## 2015-11-06 NOTE — Anesthesia Postprocedure Evaluation (Signed)
Anesthesia Post Note  Patient: Jorge BuntingJames K Strong  Procedure(s) Performed: Procedure(s) (LRB): LAPAROSCOPIC RIGHT INGUINAL HERNIA REPAIR WITH MESH (Right) INSERTION OF MESH (Right)  Patient location during evaluation: PACU Anesthesia Type: General Level of consciousness: awake and alert and patient cooperative Pain management: pain level controlled Vital Signs Assessment: post-procedure vital signs reviewed and stable Respiratory status: spontaneous breathing and respiratory function stable Cardiovascular status: stable Anesthetic complications: no    Last Vitals:  Filed Vitals:   11/02/15 1258 11/02/15 1400  BP: 131/73 141/82  Pulse: 71 80  Temp: 36.4 C 36.4 C  Resp: 12 16    Last Pain:  Filed Vitals:   11/02/15 1411  PainSc: 4                  [name]

## 2015-11-29 MED FILL — MOMETASONE FUROATE 50 MCG S: 50 | 30 days supply | Qty: 17 | Fill #2

## 2016-01-08 MED FILL — MOMETASONE FUROATE 50 MCG S: 50 | 30 days supply | Qty: 17 | Fill #3

## 2016-02-07 DIAGNOSIS — H539 Unspecified visual disturbance: Secondary | ICD-10-CM | POA: Diagnosis not present

## 2016-02-14 MED FILL — MOMETASONE FUROATE 50 MCG S: 50 | 30 days supply | Qty: 17 | Fill #4

## 2016-02-21 DIAGNOSIS — R635 Abnormal weight gain: Secondary | ICD-10-CM | POA: Diagnosis not present

## 2016-02-21 DIAGNOSIS — Z6839 Body mass index (BMI) 39.0-39.9, adult: Secondary | ICD-10-CM | POA: Diagnosis not present

## 2016-02-21 DIAGNOSIS — R6882 Decreased libido: Secondary | ICD-10-CM | POA: Diagnosis not present

## 2016-02-26 DIAGNOSIS — R635 Abnormal weight gain: Secondary | ICD-10-CM | POA: Diagnosis not present

## 2016-02-26 DIAGNOSIS — R6882 Decreased libido: Secondary | ICD-10-CM | POA: Diagnosis not present

## 2016-03-20 MED FILL — MOMETASONE FUROATE 50 MCG S: 50 | 30 days supply | Qty: 17 | Fill #5

## 2016-04-09 DIAGNOSIS — Z6839 Body mass index (BMI) 39.0-39.9, adult: Secondary | ICD-10-CM | POA: Diagnosis not present

## 2016-04-09 DIAGNOSIS — Z1211 Encounter for screening for malignant neoplasm of colon: Secondary | ICD-10-CM | POA: Diagnosis not present

## 2016-04-09 DIAGNOSIS — N529 Male erectile dysfunction, unspecified: Secondary | ICD-10-CM | POA: Diagnosis not present

## 2016-04-09 DIAGNOSIS — J309 Allergic rhinitis, unspecified: Secondary | ICD-10-CM | POA: Diagnosis not present

## 2016-04-09 DIAGNOSIS — E7800 Pure hypercholesterolemia, unspecified: Secondary | ICD-10-CM | POA: Diagnosis not present

## 2016-04-09 DIAGNOSIS — Z0000 Encounter for general adult medical examination without abnormal findings: Secondary | ICD-10-CM | POA: Diagnosis not present

## 2016-04-23 DIAGNOSIS — E7800 Pure hypercholesterolemia, unspecified: Secondary | ICD-10-CM | POA: Diagnosis not present

## 2016-04-23 DIAGNOSIS — Z0000 Encounter for general adult medical examination without abnormal findings: Secondary | ICD-10-CM | POA: Diagnosis not present

## 2016-04-23 DIAGNOSIS — Z125 Encounter for screening for malignant neoplasm of prostate: Secondary | ICD-10-CM | POA: Diagnosis not present

## 2016-04-29 MED FILL — MOMETASONE FUROATE 50 MCG S: 50 | 30 days supply | Qty: 17 | Fill #6

## 2016-05-27 MED FILL — MOMETASONE FUROATE 50 MCG S: 50 | 90 days supply | Qty: 51 | Fill #0 | Status: TO

## 2016-09-08 MED FILL — MOMETASONE FUROATE 50 MCG S: 50 | 90 days supply | Qty: 51 | Fill #0

## 2016-12-05 ENCOUNTER — Other Ambulatory Visit (HOSPITAL_COMMUNITY): Payer: Self-pay | Admitting: General Surgery

## 2016-12-08 ENCOUNTER — Other Ambulatory Visit (HOSPITAL_BASED_OUTPATIENT_CLINIC_OR_DEPARTMENT_OTHER): Payer: Self-pay

## 2016-12-08 DIAGNOSIS — G4730 Sleep apnea, unspecified: Secondary | ICD-10-CM

## 2016-12-10 ENCOUNTER — Encounter: Payer: Self-pay | Admitting: Gastroenterology

## 2016-12-10 DIAGNOSIS — E7800 Pure hypercholesterolemia, unspecified: Secondary | ICD-10-CM | POA: Diagnosis not present

## 2016-12-12 DIAGNOSIS — Z713 Dietary counseling and surveillance: Secondary | ICD-10-CM | POA: Diagnosis not present

## 2016-12-12 DIAGNOSIS — Z6841 Body Mass Index (BMI) 40.0 and over, adult: Secondary | ICD-10-CM | POA: Diagnosis not present

## 2016-12-12 DIAGNOSIS — R635 Abnormal weight gain: Secondary | ICD-10-CM | POA: Diagnosis not present

## 2016-12-22 ENCOUNTER — Ambulatory Visit (HOSPITAL_COMMUNITY)
Admission: RE | Admit: 2016-12-22 | Discharge: 2016-12-22 | Disposition: A | Discharge status: Home / Self Care | Payer: 59 | Source: Ambulatory Visit | Attending: General Surgery | Admitting: General Surgery

## 2016-12-22 ENCOUNTER — Ambulatory Visit (HOSPITAL_BASED_OUTPATIENT_CLINIC_OR_DEPARTMENT_OTHER): Payer: 59 | Attending: General Surgery | Admitting: Internal Medicine

## 2016-12-22 ENCOUNTER — Other Ambulatory Visit: Payer: Self-pay

## 2016-12-22 VITALS — Ht 77.0 in | Wt 370.0 lb

## 2016-12-22 DIAGNOSIS — R918 Other nonspecific abnormal finding of lung field: Secondary | ICD-10-CM | POA: Insufficient documentation

## 2016-12-22 DIAGNOSIS — Z01818 Encounter for other preprocedural examination: Secondary | ICD-10-CM | POA: Diagnosis not present

## 2016-12-22 DIAGNOSIS — G4733 Obstructive sleep apnea (adult) (pediatric): Secondary | ICD-10-CM | POA: Insufficient documentation

## 2016-12-22 DIAGNOSIS — G4730 Sleep apnea, unspecified: Secondary | ICD-10-CM | POA: Diagnosis present

## 2016-12-25 ENCOUNTER — Encounter: Payer: Self-pay | Admitting: Gastroenterology

## 2016-12-25 ENCOUNTER — Telehealth: Payer: Self-pay

## 2016-12-25 ENCOUNTER — Ambulatory Visit (AMBULATORY_SURGERY_CENTER): Payer: Self-pay

## 2016-12-25 VITALS — Ht 77.0 in | Wt 387.0 lb

## 2016-12-25 DIAGNOSIS — Z1211 Encounter for screening for malignant neoplasm of colon: Secondary | ICD-10-CM

## 2016-12-25 MED ORDER — SUPREP BOWEL PREP KIT 17.5-3.13-1.6 GM/177ML PO SOLN: 1.0000 | Freq: Once | ORAL | 0 refills | Status: AC

## 2016-12-25 NOTE — Telephone Encounter (Signed)
Dr Myrtie Neitheranis,      Wt>[id number]  [facility] or would you like an OV first? Thank you,      Angela/PV

## 2016-12-25 NOTE — Telephone Encounter (Signed)
[facility], but it will be at least May before I have a spot open for routine colonoscopy in my [facility] outpatient schedule.

## 2016-12-25 NOTE — Telephone Encounter (Signed)
Raynelle FanningJulie is his nurse, thanks

## 2016-12-25 NOTE — Progress Notes (Signed)
No allergies to eggs or soy No past problems with anesthesia No diet meds No home oxygen  Registered for emmi 

## 2016-12-26 ENCOUNTER — Telehealth: Payer: Self-pay | Admitting: *Deleted

## 2016-12-26 NOTE — Telephone Encounter (Signed)
Jorge Strong, the schedule is not available yet for May. Once that is set up I will be glad to call the patient to schedule. Is the patient aware it will not be until May? Thanks, Raynelle FanningJulie

## 2016-12-26 NOTE — Telephone Encounter (Signed)
LMOM explaining [facility] be [facility].  Asked him to call office back so we can discuss this  Raynelle FanningJulie, I will let you know as soon as the pt calls me back.  Thanks, WPS ResourcesKristen

## 2016-12-26 NOTE — Telephone Encounter (Signed)
Raynelle FanningJulie,  Could you set up this hospital appointment please?  Thanks, [employer] ResourcesKristen

## 2016-12-26 NOTE — Telephone Encounter (Signed)
I will see what is available, but may not be able to accommodate that for a screening procedure.  There is very limited time available in the [facility] endo schedule, and I am already doing quite a few therapeutic procedures outside of that block time. I will see what I can do.  If not, you are welcome to survey the other docs but suspect they may be in same situation.  Amil AmenJulia,    Please see if [facility] endo can start my block at 7:30 on 3/6 or run later that day (although I do have to start clinic at 1:30 that day).  If not, see if any other March dates when I have AM clinic and may be able to [facility].  Run those potential dates by me first.

## 2016-12-26 NOTE — Telephone Encounter (Signed)
Dr. Myrtie Neitheranis,  This is a pt Jorge Strong checked with you about yesterday.  His weight is greater than 350 and you gave us to the ok to directly schedule [facility] at [facility] sometime in May.  Is there any possibility this could be done sooner or by one of our other physicians?  This is a screening colonoscopy and you have never seen this pt.  The reason I am asking is because he is tentatively scheduled for bariatric surgery on 02-09-17 and needs to have this done before his surgery.  I just wanted to see if it is possible to have him done sooner than that date.  Thank you, Baxter HireKristen

## 2016-12-29 MED FILL — MOMETASONE FUROATE 50 MCG S: 50 | 30 days supply | Qty: 17 | Fill #1

## 2016-12-30 NOTE — Telephone Encounter (Signed)
I have found a 0730 slot on 02/05/17, Thursday. You start office at 0830. Will this work for you?

## 2016-12-30 NOTE — Telephone Encounter (Signed)
Amil AmenJulia,  I am off tomorrow. If Dr. Myrtie Neitheranis gives the ok for this procedure time, could you please call the pt for me?  I told him I would call back as soon as we heard form Dr. Myrtie Neitheranis.  Thanks, [employer] ResourcesKristen

## 2016-12-31 ENCOUNTER — Telehealth: Payer: Self-pay

## 2016-12-31 NOTE — Telephone Encounter (Signed)
Yes, I will come in early on March 29th to do his colonoscopy.

## 2016-12-31 NOTE — Telephone Encounter (Signed)
Patient aware of scheduled colonoscopy on 3/29 @ [facility] 0730. Patient has coupon for Suprep, told him that I would send out new prep instructions with the date/time information.

## 2017-01-01 ENCOUNTER — Other Ambulatory Visit: Payer: Self-pay

## 2017-01-01 DIAGNOSIS — Z1211 Encounter for screening for malignant neoplasm of colon: Secondary | ICD-10-CM

## 2017-01-01 MED FILL — SUPREP BOWEL PREP KIT: 17.5-3.13-1 | 2 days supply | Qty: 354 | Fill #0

## 2017-01-03 DIAGNOSIS — G4730 Sleep apnea, unspecified: Secondary | ICD-10-CM | POA: Diagnosis not present

## 2017-01-03 NOTE — Procedures (Signed)
Patient Name: Jorge Strong, Jorge Strong Date: 12/22/2016 Gender: Male D.O.B: 06-May-1966 Age (years): 89 Referring Provider: Lurena Joiner Kinsinger Height (inches): 27 Interpreting Physician: Baird Lyons MD, ABSM Weight (lbs): 370 RPSGT: Baxter Flattery BMI: 46 MRN: [id number] Neck Size: 18.00 CLINICAL INFORMATION Sleep Study Type: Split Night CPAP  Indication for sleep study: Fatigue, Obesity, Snoring, Witnessed Apneas  Epworth Sleepiness Score: 5  SLEEP STUDY TECHNIQUE As per the AASM Manual for the Scoring of Sleep and Associated Events v2.3 (April 2016) with a hypopnea requiring 4% desaturations.  The channels recorded and monitored were frontal, central and occipital EEG, electrooculogram (EOG), submentalis EMG (chin), nasal and oral airflow, thoracic and abdominal wall motion, anterior tibialis EMG, snore microphone, electrocardiogram, and pulse oximetry. Continuous positive airway pressure (CPAP) was initiated when the patient met split night criteria and was titrated according to treat sleep-disordered breathing.  MEDICATIONS Medications self-administered by patient taken the night of the study : none reported  RESPIRATORY PARAMETERS Diagnostic  Total AHI (/hr): 58.0 RDI (/hr): 58.4 OA Index (/hr): 16.5 CA Index (/hr): 0.4 REM AHI (/hr): 63.2 NREM AHI (/hr): 57.1 Supine AHI (/hr): N/A Non-supine AHI (/hr): 57.99 Min O2 Sat (%): 62.00 Mean O2 (%): 90.29 Time below 88% (min): 25.4   Titration  Optimal Pressure (cm): 16 AHI at Optimal Pressure (/hr): 0.0 Min O2 at Optimal Pressure (%): 95.0 Supine % at Optimal (%): 0 Sleep % at Optimal (%): 81   SLEEP ARCHITECTURE The recording time for the entire night was 368.6 minutes.  During a baseline period of 178.7 minutes, the patient slept for 134.5 minutes in REM and nonREM, yielding a sleep efficiency of 75.3%. Sleep onset after lights out was 23.1 minutes with a REM latency of 134.5 minutes. The patient spent 6.69% of the night in  stage N1 sleep, 64.31% in stage N2 sleep, 14.87% in stage N3 and 14.13% in REM.  During the titration period of 184.7 minutes, the patient slept for 147.0 minutes in REM and nonREM, yielding a sleep efficiency of 79.6%. Sleep onset after CPAP initiation was 25.2 minutes with a REM latency of 34.5 minutes. The patient spent 4.08% of the night in stage N1 sleep, 42.18% in stage N2 sleep, 17.69% in stage N3 and 36.05% in REM.  CARDIAC DATA The 2 lead EKG demonstrated sinus rhythm. The mean heart rate was 75.36 beats per minute. Other EKG findings include: None.  LEG MOVEMENT DATA The total Periodic Limb Movements of Sleep (PLMS) were 22. The PLMS index was 4.69 .  IMPRESSIONS - Severe obstructive sleep apnea occurred during the diagnostic portion of the study (AHI = 58.0/hour). An optimal PAP pressure was selected for this patient ( 16 cm of water) - No significant central sleep apnea occurred during the diagnostic portion of the study (CAI = 0.4/hour). - Mild oxygen desaturation was noted during the diagnostic portion of the study (Min O2 = 62.00%). - No snoring was audible during the diagnostic portion of the study. - No cardiac abnormalities were noted during this study. - Mild periodic limb movements of sleep occurred during the study.  DIAGNOSIS - Obstructive Sleep Apnea (327.23 [G47.33 ICD-10])  RECOMMENDATIONS - Trial of CPAP therapy on 16 cm H2O with a Large size Resmed Full Face Mask AirFit F20 mask and heated humidification. - Avoid alcohol, sedatives and other CNS depressants that may worsen sleep apnea and disrupt normal sleep architecture. - Sleep hygiene should be reviewed to assess factors that may improve sleep quality. - Weight management and regular exercise  should be initiated or continued.  [Electronically signed] 01/03/2017 10:55 AM  Baird Lyons MD, ABSM Diplomate, American Board of Sleep Medicine   NPI: [id number]  [place], American Board of  Sleep Medicine  ELECTRONICALLY SIGNED ON:  01/03/2017, 10:54 AM [place] PH: [phone]   FX: [phone] [place]

## 2017-01-05 ENCOUNTER — Encounter: Payer: 59 | Attending: General Surgery | Admitting: Skilled Nursing Facility1

## 2017-01-05 ENCOUNTER — Encounter: Payer: Self-pay | Admitting: Skilled Nursing Facility1

## 2017-01-05 DIAGNOSIS — E669 Obesity, unspecified: Secondary | ICD-10-CM

## 2017-01-05 DIAGNOSIS — Z713 Dietary counseling and surveillance: Secondary | ICD-10-CM | POA: Insufficient documentation

## 2017-01-05 NOTE — Patient Instructions (Signed)
Follow Pre-Op Goals Try Protein Shakes Call [facility] at [phone] when surgery is scheduled to enroll in Pre-Op Class  Things to remember:  Please always be honest with us. We want to support you!  If you have any questions or concerns in between appointments, please call or email   The diet after surgery will be high protein and low in carbohydrate.  Vitamins and calcium need to be taken for the rest of your life.  Feel free to include support people in any classes or appointments.   Supplement recommendations:  Before Surgery   1 Complete Multivitamin with Iron  3000 IU Vitamin D3  After Surgery   2 Chewable Multivitamins  **Best Choice - Bariatric Advantage Advanced Multi EA      3 Chewable Calcium (500 mg each, total 1200-1500 mg per day)  **Best Choice - Celebrate, Bariatric Advantage, or Wellesse  Other Options:    2 Flinstones Complete + up to 100 mg Thiamin + 2000-3000 IU Vitamin D3 + 350-500 mcg Vitamin B12 + 30-45 mg Iron (with history of deficiency)  2 Celebrate MultiComplete with 18 mg Iron (this provides 6000 IU of  Vitamin D3)  4 Celebrate Essential Multi 2 in 1 (has calcium) + 18-60 mg separate  iron  Vitamins and Calcium are available at:   [facility]   [address], [place],  [address]   [email or web address]  [email or web address]  [email or web address]    

## 2017-01-05 NOTE — Progress Notes (Signed)
Pre-Op Assessment Visit:  Pre-Operative Sleeve Gastrectomy Surgery  Medical Nutrition Therapy:  Appt start time: 7:26  End time:  8:26  Patient was seen on 01/05/2017 for Pre-Operative Nutrition Assessment. Assessment and letter of approval [facility] coordinator on 01/05/2017.  Pt states he is an avid hiker [place]. Pt states he has already started the pre-op diet.  Start weight at [facility]: 385.3 BMI: 47.07  24 hr Dietary Recall: First Meal: cinnamon toast with butter and sugar and egg and cheese and sausage sandwich Snack: candy or chips Second Meal: fast food Snack:  Third Meal: restaurant  Snack:  Beverages: coffee, sweet tea, water  Encouraged to engage in 150 minutes of moderate physical activity including cardiovascular and weight baring weekly  Handouts given during visit include:  . Pre-Op Goals . Bariatric Surgery Protein Shakes During the appointment today the following Pre-Op Goals were reviewed with the patient: . Maintain or lose weight as instructed by your surgeon . Make healthy food choices . Begin to limit portion sizes . Limited concentrated sugars and fried foods . Keep fat/sugar in the single digits per serving on          food labels . Practice CHEWING your food  (aim for 30 chews per bite or until applesauce consistency) . Practice not drinking 15 minutes before, during, and 30 minutes after each meal/snack . Avoid all carbonated beverages  . Avoid/limit caffeinated beverages  . Avoid all sugar-sweetened beverages . Consume 3 meals per day; eat every 3-5 hours . Make a list of non-food related activities . Aim for 64-100 ounces of FLUID daily  . Aim for at least 60-80 grams of PROTEIN daily . Look for a liquid protein source that contain ?15 g protein and ?5 g carbohydrate  (ex: shakes, drinks, shots)  -Follow diet recommendations listed below   Energy and  Macronutrient Recomendations: Calories: 2000 Carbohydrate: 225 Protein: 150 Fat: 56  Demonstrated degree of understanding via:  Teach Back  Teaching Method Utilized:  Visual Auditory Hands on  Barriers to learning/adherence to lifestyle change: none identified   Patient to call the [facility] to enroll in Pre-Op and Post-Op Nutrition Education when surgery date is scheduled.

## 2017-01-08 ENCOUNTER — Encounter: Payer: 59 | Admitting: Gastroenterology

## 2017-01-12 ENCOUNTER — Telehealth: Payer: Self-pay | Admitting: Pulmonary Disease

## 2017-01-12 NOTE — Telephone Encounter (Signed)
error 

## 2017-01-13 ENCOUNTER — Ambulatory Visit (INDEPENDENT_AMBULATORY_CARE_PROVIDER_SITE_OTHER): Payer: 59 | Admitting: Psychiatry

## 2017-01-13 DIAGNOSIS — F509 Eating disorder, unspecified: Secondary | ICD-10-CM | POA: Diagnosis not present

## 2017-01-15 ENCOUNTER — Ambulatory Visit: Payer: Self-pay | Admitting: Psychiatry

## 2017-01-19 ENCOUNTER — Ambulatory Visit: Payer: 59

## 2017-01-20 ENCOUNTER — Encounter: Payer: 59 | Attending: General Surgery | Admitting: Skilled Nursing Facility1

## 2017-01-20 DIAGNOSIS — E669 Obesity, unspecified: Secondary | ICD-10-CM

## 2017-01-20 DIAGNOSIS — Z713 Dietary counseling and surveillance: Secondary | ICD-10-CM | POA: Diagnosis not present

## 2017-01-21 ENCOUNTER — Encounter: Payer: Self-pay | Admitting: Pulmonary Disease

## 2017-01-21 ENCOUNTER — Ambulatory Visit (INDEPENDENT_AMBULATORY_CARE_PROVIDER_SITE_OTHER): Payer: 59 | Admitting: Psychiatry

## 2017-01-21 ENCOUNTER — Encounter: Payer: Self-pay | Admitting: Skilled Nursing Facility1

## 2017-01-21 ENCOUNTER — Ambulatory Visit (INDEPENDENT_AMBULATORY_CARE_PROVIDER_SITE_OTHER)
Admission: RE | Admit: 2017-01-21 | Discharge: 2017-01-21 | Disposition: A | Discharge status: Home / Self Care | Payer: 59 | Source: Ambulatory Visit | Attending: Pulmonary Disease | Admitting: Pulmonary Disease

## 2017-01-21 ENCOUNTER — Ambulatory Visit (INDEPENDENT_AMBULATORY_CARE_PROVIDER_SITE_OTHER): Payer: 59 | Admitting: Pulmonary Disease

## 2017-01-21 VITALS — BP 144/90 | HR 92 | Ht 75.0 in | Wt 392.4 lb

## 2017-01-21 DIAGNOSIS — F509 Eating disorder, unspecified: Secondary | ICD-10-CM | POA: Diagnosis not present

## 2017-01-21 DIAGNOSIS — Z01818 Encounter for other preprocedural examination: Secondary | ICD-10-CM | POA: Diagnosis not present

## 2017-01-21 DIAGNOSIS — G4733 Obstructive sleep apnea (adult) (pediatric): Secondary | ICD-10-CM | POA: Diagnosis not present

## 2017-01-21 DIAGNOSIS — J189 Pneumonia, unspecified organism: Secondary | ICD-10-CM | POA: Diagnosis not present

## 2017-01-21 NOTE — Patient Instructions (Addendum)
  It was a pleasure taking care of you today!  You are diagnosed with Obstructive Sleep Apnea or OSA.  You stop breathing  56   times an hour.   We will order you an autoCPAP  Machine, set at 16 cm water.  Please call the office if you do NOT receive your machine in the next week.   Please make sure you use your CPAP device everytime you sleep.  We will monitor the usage of your machine per your insurance requirement.  Your insurance company may take the machine from you if you are not using it regularly.   Please clean the mask, tubings, filter, water reservoir with soapy water every week.  Please use distilled water for the water reservoir.   Please call the office or your machine provider (DME company) if you are having issues with the device.   Return to clinic in 6-8 weeks with Dr. Christene Slatese Dios or NP.

## 2017-01-21 NOTE — Assessment & Plan Note (Addendum)
Patient is scheduled to have gastric sleeve surgery to be done by Dr. Feliciana RossettiLuke Kinsinger and is scheduled for 02/09/2017.   He has severe sleep apnea with an AHI of 56. Controlled on 16 cm water CPAP. We will order CPAP therapy today. I extensively discussed with the patient his increased risks for possible pulmonary complications given  his severe sleep apnea. He understands his risks and accepts his risks. I recommended to bring his CPAP machine during the surgery. I recommend that he be monitored closely postoperatively as sleep apnea worsens with anesthesia. Most likely will need BiPAP/Cpap post op while in PACU +/- ICU.   CXR done in 12/2016 with possible infiltrate versus atelectasis in the left upper lung zone. Most likely atelectasis. Plan for repeat chest x-ray today.  Final clearance pending cpap download.

## 2017-01-21 NOTE — Progress Notes (Signed)
  Pre-Operative Nutrition Class:  Appt start time: 0093   End time:  1830.  Patient was seen on 01/20/2017 for Pre-Operative Bariatric Surgery [facility].   Surgery date: March 29 Surgery type: sleeve gastrectomy  [facility] weight at [facility]: 385.5 Weight today: pt declined   TANITA  BODY COMP RESULTS  Pt declined   BMI (kg/m^2)    Fat Mass (lbs)    Fat Free Mass (lbs)    Total Body Water (lbs)    Samples given per MNT protocol. Patient educated on appropriate usage: Bariatric Advantage Multivitamin Lot # [id number] Exp: 06/19  Bariatric Advantage Calcium Citrate Lot # [id number] Exp: 05/19/17  Unjury Protein Powder Lot # [id number] Exp: 05/2018  The following the learning objectives were met by the patient during this course:  Identify Pre-Op Dietary Goals and will begin 2 weeks pre-operatively  Identify appropriate sources of fluids and proteins   State protein recommendations and appropriate sources pre and post-operatively  Identify Post-Operative Dietary Goals and will follow for 2 weeks post-operatively  Identify appropriate multivitamin and calcium sources  Describe the need for physical activity post-operatively and will follow MD recommendations  State when to call healthcare provider regarding medication questions or post-operative complications  Handouts given during class include:  Pre-Op Bariatric Surgery Diet Handout  Protein Shake Handout  Post-Op Bariatric Surgery Nutrition Handout  [facility] Program Information Flyer  Support Group Information Flyer  [facility] Bariatric Supplements Price List  Follow-Up Plan: Patient will follow-up at [facility] 2 weeks post operatively for diet advancement per MD.

## 2017-01-21 NOTE — Progress Notes (Signed)
Subjective:    Patient ID: Jorge BuntingJames K Kinker, male    DOB: January 01, 1966, 51 y.o.   MRN: [id number]  HPI   This is the case of Jorge Strong, 51 y.o. Male, who was referred by Dr. Feliciana RossettiLuke Kinsinger in consultation regarding OSA.   As you very well know, patient is a non smoker, not known to have asthma or copd. Patient has snoring, gasping, choking, frequent awakenings, has unrefreshed sleep.  Feels tired and unrefreshed in am. Has gained 80 lbs the last yr. He sleeps in during weekend. (-) abnormal behavior in sleep. Very symptomatic.   He had a split night sleep study in 12/2016 where AHI was 56, optimal on 16 cm water.  He felt better after the sleep study. Tolertaed cpap well. Had a FFM.   ESS 11.   He works at [employer] 8am-5pm. He gets sleepy in pm.    He is scheduled for gastric sleeve surgery on 02/09/2017.   CXR in 12/2016 showed possible LUL infiltrate vs atelectasis.     Review of Systems  Constitutional: Positive for unexpected weight change. Negative for fever.  HENT: Negative.  Negative for congestion, dental problem, ear pain, nosebleeds, postnasal drip, rhinorrhea, sinus pressure, sneezing, sore throat and trouble swallowing.   Eyes: Negative.  Negative for redness and itching.  Respiratory: Positive for shortness of breath. Negative for cough, chest tightness and wheezing.   Cardiovascular: Positive for leg swelling. Negative for palpitations.  Gastrointestinal: Negative.  Negative for nausea and vomiting.  Endocrine: Negative.   Genitourinary: Negative.  Negative for dysuria.  Musculoskeletal: Negative.  Negative for joint swelling.  Skin: Negative.  Negative for rash.  Allergic/Immunologic: Negative.  Negative for environmental allergies, food allergies and immunocompromised state.  Neurological: Negative.  Negative for headaches.  Hematological: Negative.  Does not bruise/bleed easily.  Psychiatric/Behavioral: Negative.  Negative for dysphoric mood. The patient is not  nervous/anxious.     Past Medical History:  Diagnosis Date  . Closed rib fracture    past hx, recent 3 days ago fell left side"? a new left rib fracture"-has not been confirmed.  . Depression    situation-took Cymbalta  . H/O seasonal allergies   . Sleep apnea   . Wound of left leg    left leg wound is healing, but remains draining clear serous-covering over wound.   (-) CA, DVT  Family History  Problem Relation Age of Onset  . Colon cancer Neg Hx    Mother had obesity. Father is healthy.   Past Surgical History:  Procedure Laterality Date  . APPENDECTOMY     age 51  . INGUINAL HERNIA REPAIR Right 11/02/2015   Procedure: LAPAROSCOPIC RIGHT INGUINAL HERNIA REPAIR WITH MESH;  Surgeon: Axel FillerArmando Ramirez, MD;  Location: [facility];  Service: General;  Laterality: Right;  . INSERTION OF MESH Right 11/02/2015   Procedure: INSERTION OF MESH;  Surgeon: Axel FillerArmando Ramirez, MD;  Location: [facility];  Service: General;  Laterality: Right;    Social History   Social History  . Marital status: Married    Spouse name: N/A  . Number of children: N/A  . Years of education: N/A   Occupational History  . Not on file.   Social History Main Topics  . Smoking status: Never Smoker  . Smokeless tobacco: Never Used  . Alcohol use Yes     Comment: socially rare  . Drug use: No  . Sexual activity: Not on file   Other Topics Concern  . Not  on file   Social History Narrative  . No narrative on file   Lives and works in [place].   No Known Allergies   Outpatient Medications Prior to Visit  Medication Sig Dispense Refill  . cetirizine (ZYRTEC) 10 MG tablet Take 10 mg by mouth daily.    Marland Kitchen ibuprofen (ADVIL,MOTRIN) 200 MG tablet Take 600-800 mg by mouth every 8 (eight) hours as needed for headache (pain).    . mometasone (NASONEX) 50 MCG/ACT nasal spray Place 1-2 sprays into the nose daily.     . sildenafil (VIAGRA) 100 MG tablet Take 50 mg by mouth daily as needed for erectile dysfunction.     No  facility-administered medications prior to visit.    No orders of the defined types were placed in this encounter.       Objective:   Physical Exam  Vitals:  Vitals:   01/21/17 1112  BP: (!) 144/90  Pulse: 92  SpO2: 98%  Weight: (!) 392 lb 6.4 oz (178 kg)  Height: 6\' 3"  (1.905 m)    Constitutional/General:  Pleasant, well-nourished, well-developed, not in any distress,  Comfortably seating.  Well kempt  Body mass index is 49.05 kg/m. Wt Readings from Last 3 Encounters:  01/21/17 (!) 392 lb 6.4 oz (178 kg)  01/05/17 (!) 385 lb 4.8 oz (174.8 kg)  12/25/16 (!) 387 lb (175.5 kg)    Neck circumference:   HEENT: Pupils equal and reactive to light and accommodation. Anicteric sclerae. Normal nasal mucosa.   No oral  lesions,  mouth clear,  oropharynx clear, no postnasal drip. (-) Oral thrush. No dental caries.  Airway - Mallampati class III  Neck: No masses. Midline trachea. No JVD, (-) LAD. (-) bruits appreciated.  Respiratory/Chest: Grossly normal chest. (-) deformity. (-) Accessory muscle use.  Symmetric expansion. (-) Tenderness on palpation.  Resonant on percussion.  Diminished BS on both lower lung zones. (-) wheezing, crackles, rhonchi (-) egophony  Cardiovascular: Regular rate and  rhythm, heart sounds normal, no murmur or gallops, no peripheral edema  Gastrointestinal:  Normal bowel sounds. Soft, non-tender. No hepatosplenomegaly.  (-) masses.   Musculoskeletal:  Normal muscle tone. Normal gait.   Extremities: Grossly normal. (-) clubbing, cyanosis.  (-) edema  Skin: (-) rash,lesions seen.   Neurological/Psychiatric : alert, oriented to time, place, person. Normal mood and affect          Assessment & Plan:  Obstructive sleep apnea Patient has snoring, gasping, choking, frequent awakenings, has unrefreshed sleep.  Feels tired and unrefreshed in am. Has gained 80 lbs the last yr. He sleeps in during weekend. (-) abnormal behavior in sleep. Very  symptomatic.   He had a split night sleep study in 12/2016 where AHI was 58, optimal on 16 cm water.  He felt better after the sleep study. Tolertaed cpap well. Had a FFM.   ESS 11.   He works at [employer]. He gets sleepy in pm.   He is scheduled for gastric sleeve surgery on 02/09/2017.    Plan :  We extensively discussed the diagnosis, pathophysiology, and treatment options for Obstructive Sleep Apnea (OSA).   We discussed treatment options for OSA including CPAP, BiPaP, as well as surgical options and oral devices.   We will start patient on autocpap set at 16 cm water. He has friends with sleep apnea using CPAP. Anticipate no issues with CPAP. He tolerated 16 cm water and felt better after the study. Several months after the surgery, he will probably  need a lower pressure. Potentially may need BiPAP if the sleep apnea gets worse or if he cannot tolerate high CPAP settings. Will need 1-2 week data with cpap to make sure OSA is corrected and he tolerates cpap before gastric sleeve surgery.   Patient was instructed to call the office if he/she has not received the PAP device in 1-2 weeks.  Patient was instructed to have mask, tubings, filter, reservoir cleaned at least once a week with soapy water.  Patient was instructed to call the office if he/she is having issues with the PAP device.    I advised patient to obtain sufficient amount of sleep --  7 to 8 hours at least in a 24 hr period.  Patient was advised to follow good sleep hygiene.  Patient was advised NOT to engage in activities requiring concentration and/or vigilance if he/she is and  sleepy.  Patient is NOT to drive if he/she is sleepy.      Preoperative clearance Patient is scheduled to have gastric sleeve surgery to be done by Dr. Feliciana Rossetti and is scheduled for 02/09/2017.   He has severe sleep apnea with an AHI of 56. Controlled on 16 cm water CPAP. We will order CPAP therapy today. I extensively discussed with the  patient his increased risks for possible pulmonary complications given  his severe sleep apnea. He understands his risks and accepts his risks. I recommended to bring his CPAP machine during the surgery. I recommend that he be monitored closely postoperatively as sleep apnea worsens with anesthesia. Most likely will need BiPAP/Cpap post op while in PACU +/- ICU.   CXR done in 12/2016 with possible infiltrate versus atelectasis in the left upper lung zone. Most likely atelectasis. Plan for repeat chest x-ray today.  Final clearance pending cpap download.      Thank you very much for letting me participate in this patient's care. Please do not hesitate to give me a call if you have any questions or concerns regarding the treatment plan.   Patient will follow up with me in 8-10 weeks.     Pollie Meyer, MD 01/21/2017   12:02 PM Pulmonary and Critical Care Medicine [facility] Pager: [phone] Office: [phone], Fax: [phone]

## 2017-01-21 NOTE — Assessment & Plan Note (Addendum)
Patient has snoring, gasping, choking, frequent awakenings, has unrefreshed sleep.  Feels tired and unrefreshed in am. Has gained 80 lbs the last yr. He sleeps in during weekend. (-) abnormal behavior in sleep. Very symptomatic.   He had a split night sleep study in 12/2016 where AHI was 58, optimal on 16 cm water.  He felt better after the sleep study. Tolertaed cpap well. Had a FFM.   ESS 11.   He works at [employer] 8am-5pm. He gets sleepy in pm.   He is scheduled for gastric sleeve surgery on 02/09/2017.    Plan :  We extensively discussed the diagnosis, pathophysiology, and treatment options for Obstructive Sleep Apnea (OSA).   We discussed treatment options for OSA including CPAP, BiPaP, as well as surgical options and oral devices.   We will start patient on autocpap set at 16 cm water. He has friends with sleep apnea using CPAP. Anticipate no issues with CPAP. He tolerated 16 cm water and felt better after the study. Several months after the surgery, he will probably need a lower pressure. Potentially may need BiPAP if the sleep apnea gets worse or if he cannot tolerate high CPAP settings. Will need 1-2 week data with cpap to make sure OSA is corrected and he tolerates cpap before gastric sleeve surgery.   Patient was instructed to call the office if he/she has not received the PAP device in 1-2 weeks.  Patient was instructed to have mask, tubings, filter, reservoir cleaned at least once a week with soapy water.  Patient was instructed to call the office if he/she is having issues with the PAP device.    I advised patient to obtain sufficient amount of sleep --  7 to 8 hours at least in a 24 hr period.  Patient was advised to follow good sleep hygiene.  Patient was advised NOT to engage in activities requiring concentration and/or vigilance if he/she is and  sleepy.  Patient is NOT to drive if he/she is sleepy.

## 2017-01-23 ENCOUNTER — Ambulatory Visit: Payer: Self-pay | Admitting: General Surgery

## 2017-01-23 DIAGNOSIS — G4730 Sleep apnea, unspecified: Secondary | ICD-10-CM | POA: Diagnosis not present

## 2017-01-23 MED FILL — MOMETASONE FUROATE 50 MCG S: 50 | 90 days supply | Qty: 51 | Fill #2

## 2017-01-23 NOTE — H&P (Signed)
History of Present Illness (Jorge Effertz A. Tabby Beaston MD; 01/23/2017 10:46 AM) The patient is a 51 year old male who presents for a bariatric surgery evaluation. Patient has completed all necessary workup in the preoperative phase for bariatric surgery. The patient is now ready to proceed with surgery. The patient has made moderate improvements in her diet and is exercising well. He was diagnosed with OSA and will get his CPAP on Monday.    Allergies (Jorge Strong, CMA; 01/23/2017 10:27 AM) No Known Drug Allergies 08/30/2015  Medication History (Jorge Strong, CMA; 01/23/2017 10:27 AM) Viagra (100MG Tablet, Oral as needed) Active. ZyrTEC Allergy (10MG Capsule, Oral) Active. Nasonex (50MCG/ACT Suspension, Nasal) Active. Medications Reconciled    Review of Systems (Jorge Polyakov A. Jorge Mcconathy MD; 01/23/2017 10:47 AM) General Not Present- Appetite Loss, Chills, Fatigue, Fever, Night Sweats, Weight Gain and Weight Loss. Skin Not Present- Change in Wart/Mole, Dryness, Hives, Jaundice, New Lesions, Non-Healing Wounds, Rash and Ulcer. HEENT Present- Seasonal Allergies and Wears glasses/contact lenses. Not Present- Earache, Hearing Loss, Hoarseness, Nose Bleed, Oral Ulcers, Ringing in the Ears, Sinus Pain, Sore Throat, Visual Disturbances and Yellow Eyes. Respiratory Present- Chronic Cough. Not Present- Bloody sputum, Difficulty Breathing, Snoring and Wheezing. Breast Not Present- Breast Mass, Breast Pain, Nipple Discharge and Skin Changes. Cardiovascular Not Present- Chest Pain, Difficulty Breathing Lying Down, Leg Cramps, Palpitations, Rapid Heart Rate, Shortness of Breath and Swelling of Extremities. Gastrointestinal Not Present- Abdominal Pain, Bloating, Bloody Stool, Change in Bowel Habits, Chronic diarrhea, Constipation, Difficulty Swallowing, Excessive gas, Gets full quickly at meals, Hemorrhoids, Indigestion, Nausea, Rectal Pain and Vomiting. Male Genitourinary Not Present- Blood in Urine, Change in  Urinary Stream, Frequency, Impotence, Nocturia, Painful Urination, Urgency and Urine Leakage. Musculoskeletal Not Present- Back Pain, Joint Pain, Joint Stiffness, Muscle Pain, Muscle Weakness and Swelling of Extremities. Neurological Not Present- Decreased Memory, Fainting, Headaches, Numbness, Seizures, Tingling, Tremor, Trouble walking and Weakness. Psychiatric Present- Anxiety and Depression. Not Present- Bipolar, Change in Sleep Pattern, Fearful and Frequent crying. Endocrine Not Present- Cold Intolerance, Excessive Hunger, Hair Changes, Heat Intolerance and New Diabetes. Hematology Not Present- Easy Bruising, Excessive bleeding, Gland problems, HIV and Persistent Infections.  Vitals (Jorge Strong CMA; 01/23/2017 10:27 AM) 01/23/2017 10:27 AM Weight: 393.6 lb Height: 77in Body Surface Area: 2.98 m Body Mass Index: 46.67 kg/m  Temp.: 98.4F  Pulse: 91 (Regular)  BP: 142/88 (Sitting, Left Arm, Standard)       Physical Exam (Jorge Provencio A. Jorge Portales MD; 01/23/2017 10:47 AM) General Mental Status-Alert. General Appearance-Cooperative. Orientation-Oriented X4. Build & Nutrition-Obese. Posture-Normal posture.  Integumentary Global Assessment Upon inspection and palpation of skin surfaces of the - Head/Face: no rashes, ulcers, lesions or evidence of photo damage. No palpable nodules or masses and Neck: no visible lesions or palpable masses.  Head and Neck Head-normocephalic, atraumatic with no lesions or palpable masses. Face Global Assessment - atraumatic. Thyroid Gland Characteristics - normal size and consistency.  Eye Eyeball - Bilateral-Extraocular movements intact. Sclera/Conjunctiva - Bilateral-No scleral icterus, No Discharge.  ENMT Nose and Sinuses External Inspection of the Nose - no deformities observed, no swelling present.  Chest and Lung Exam Palpation Palpation of the chest reveals - Non-tender. Auscultation Breath sounds -  Normal.  Cardiovascular Auscultation Rhythm - Regular. Heart Sounds - S1 WNL and S2 WNL. Carotid arteries - No Carotid bruit.  Abdomen Inspection Inspection of the abdomen reveals - No Visible peristalsis, No Abnormal pulsations and No Paradoxical movements. Palpation/Percussion Palpation and Percussion of the abdomen reveal - Soft, Non Tender, No Rebound tenderness, No   Rigidity (guarding), No hepatosplenomegaly and No Palpable abdominal masses.  Peripheral Vascular Upper Extremity Palpation - Pulses bilaterally normal. Lower Extremity Palpation - Edema - Bilateral - No edema. Note: trace edema and discoloration of ankles   Neurologic Neurologic evaluation reveals -normal sensation and normal coordination.  Neuropsychiatric Mental status exam performed with findings of-able to articulate well with normal speech/language, rate, volume and coherence and thought content normal with ability to perform basic computations and apply abstract reasoning.  Musculoskeletal Normal Exam - Bilateral-Upper Extremity Strength Normal and Lower Extremity Strength Normal.    Assessment & Plan Jorge Strong(Jorge Kopf A. Jorge Hagins MD; 01/23/2017 10:48 AM) MORBID OBESITY (E66.01) Story: 51 yo male most interested in sleeve. I think this is a good choice for him. He has completed all preop requirements and is ready to undergo the procedure. Impression: We again discussed the details of the operation: Will be done under general anesthesia with an endotracheal tube, that it will be a laparoscopic procedure with 6 small incisions large one being 1.5-2in, that we will remove remove the short gastrics and other vessels to the greater curve of the stomach, place a large Bougie down the stomach and then remove a percent of the stomach using a linear stapler. We discussed the procedure will take approximately 1.5-2 hours. We discussed risks of VTE, staple line leak, skin infection, sleeve stenosis, incisional hernia, need  for open procedure, and postoperative nausea and vomiting. Current Plans Started OxyCODONE HCl 5MG /5ML, 5-10 Milliliter every four hours, as needed, 100 Milliliter, 01/23/2017, No Refill. Started Protonix 40MG , 1 (one) Tablet daily, #30, 01/23/2017, Ref. x2. Started Zofran ODT 4MG , 1 (one) Tablet every six hours, as needed. Local Order: As needed for nasuea SLEEP APNEA (G47.30) Impression: will get CPAP on 3/19

## 2017-01-26 DIAGNOSIS — G4733 Obstructive sleep apnea (adult) (pediatric): Secondary | ICD-10-CM | POA: Diagnosis not present

## 2017-01-29 NOTE — Progress Notes (Addendum)
Your procedure is scheduled on:02-05-2017 Report to [facility] Admitting at:600 am  Call this number if you have problems morning of your procedure:[phone]  Follow all bowel prep instructions per your doctor's orders.  Do not eat or drink anything after midnight the night before your procedure. You may brush your teeth, rinse out your mouth, but no water, no food, no chewing gum, no mints, no candies, no chewing tobacco.  PLEASE BRING PHOTO ID AND INSURANCE CARD.    Take these medicines the morning of your procedure with A SIP OF WATER:CERTRIZINE  (ZYRTEC), NASONEX NASAL SPRAY, PANTAPRAZOLE (PROTONIX), VISINE EYE DROPS IF NEEDED, bring cpap mask and tubing   Please make arrangements for a responsible person to drive you home after the procedure. You cannot go home by cab/taxi. We recommend you have someone with you at home the first 24 hours after your procedure. Driver MUST BE 18 YEARS OR OLDER.  LEAVE ALL VALUABLES, JEWELRY, BILLFOLD AT HOME.  NO DENTURES, CONTACT LENSES ALLOWED IN THE ENDOSCOPY ROOM.   YOU MAY WEAR DEODORANT, PLEASE REMOVE ALL JEWELRY, WATCHES RINGS, BODY PIERCINGS AND LEAVE AT HOME.   MEN: NO AFTER SHAVES, LOTIONS, COLOGNES POWDERS.   QUESTIONS PLEASE CALL [facility] OP NURSE Elbie Statzer PHONE [phone] BETWEEN THE HOURS OF 800 AM TO 400 PM Monday TO Friday. THANKS!

## 2017-01-30 ENCOUNTER — Telehealth: Payer: Self-pay | Admitting: Pulmonary Disease

## 2017-01-30 DIAGNOSIS — G4733 Obstructive sleep apnea (adult) (pediatric): Secondary | ICD-10-CM

## 2017-01-30 NOTE — Telephone Encounter (Signed)
   Can we get a current download?   Can we try to decrease his settings from 16 cm water to 14 cm water? Can we get a 1 month  download on 14 cm water?  Thanks.  Pollie MeyerJ. Angelo A de Dios, MD 01/30/2017, 6:40 PM [facility] Pager [phone] After 3 pm or if no answer, call [phone]

## 2017-01-30 NOTE — Telephone Encounter (Signed)
AD  Please Advise-  Pt called in and stated his pressure setting is too strong and if we can send in an order to lower it.

## 2017-02-02 NOTE — Patient Instructions (Signed)
Rob BuntingJames K Casagrande  02/02/2017   Your procedure is scheduled on: 02/09/17  [facility]  Entrance [place]  elevators to 3rd floor to  Short Stay Center at    0715 AM.  Call this number if you have problems the morning of surgery [phone]   Remember: ONLY 1 PERSON MAY GO WITH YOU TO SHORT STAY TO GET  READY MORNING OF YOUR SURGERY.  Do not eat food or drink liquids :After Midnight.     Take these medicines the morning of surgery with A SIP OF WATER:   Pepcid, Zyrtec, nasonex                                You may not have any metal on your body including hair pins and              piercings  Do not wear jewelry,  lotions, powders or perfumes, deodorant             Do not wear nail polish.  Do not shave  48 hours prior to surgery.              Men may shave face and neck.   Do not bring valuables to the hospital. [place] IS NOT             RESPONSIBLE   FOR VALUABLES.  Contacts, dentures or bridgework may not be worn into surgery.  Leave suitcase in the car. After surgery it may be brought to your room.               Please read over the following fact sheets you were given: _____________________________________________________________________             [facility] Pre[facility] Before surgery, you can play an important role.  Because skin is not sterile, your skin needs to be as free of germs as possible.  You can reduce the number of germs on your skin by washing with CHG (chlorahexidine gluconate) soap before surgery.  CHG is an antiseptic cleaner which kills germs and bonds with the skin to continue killing germs even after washing. Please DO NOT use if you have an allergy to CHG or antibacterial soaps.  If your skin becomes reddened/irritated stop using the CHG and inform your nurse when you arrive at Short Stay. Do not shave (including legs and underarms) for at least 48 hours prior to the first CHG shower.  You may shave your  face/neck. Please follow these instructions carefully:  1.  Shower with CHG Soap the night before surgery and the  morning of Surgery.  2.  If you choose to wash your hair, wash your hair first as usual with your  normal  shampoo.  3.  After you shampoo, rinse your hair and body thoroughly to remove the  shampoo.                           4.  Use CHG as you would any other liquid soap.  You can apply chg directly  to the skin and wash                       Gently with a scrungie or clean washcloth.  5.  Apply the CHG Soap to your body ONLY FROM THE NECK DOWN.   Do not use on face/ open                           Wound or open sores. Avoid contact with eyes, ears mouth and genitals (private parts).                       Wash face,  Genitals (private parts) with your normal soap.             6.  Wash thoroughly, paying special attention to the area where your surgery  will be performed.  7.  Thoroughly rinse your body with warm water from the neck down.  8.  DO NOT shower/wash with your normal soap after using and rinsing off  the CHG Soap.                9.  Pat yourself dry with a clean towel.            10.  Wear clean pajamas.            11.  Place clean sheets on your bed the night of your first shower and do not  sleep with pets. Day of Surgery : Do not apply any lotions/deodorants the morning of surgery.  Please wear clean clothes to the hospital/surgery center.  FAILURE TO FOLLOW THESE INSTRUCTIONS MAY RESULT IN THE CANCELLATION OF YOUR SURGERY PATIENT SIGNATURE_________________________________  NURSE SIGNATURE__________________________________  ________________________________________________________________________   Adam Phenix  An incentive spirometer is a tool that can help keep your lungs clear and active. This tool measures how well you are filling your lungs with each breath. Taking long deep breaths may help reverse or decrease the chance of developing breathing  (pulmonary) problems (especially infection) following:  A long period of time when you are unable to move or be active. BEFORE THE PROCEDURE   If the spirometer includes an indicator to show your best effort, your nurse or respiratory therapist will set it to a desired goal.  If possible, sit up straight or lean slightly forward. Try not to slouch.  Hold the incentive spirometer in an upright position. INSTRUCTIONS FOR USE  1. Sit on the edge of your bed if possible, or sit up as far as you can in bed or on a chair. 2. Hold the incentive spirometer in an upright position. 3. Breathe out normally. 4. Place the mouthpiece in your mouth and seal your lips tightly around it. 5. Breathe in slowly and as deeply as possible, raising the piston or the ball toward the top of the column. 6. Hold your breath for 3-5 seconds or for as long as possible. Allow the piston or ball to fall to the bottom of the column. 7. Remove the mouthpiece from your mouth and breathe out normally. 8. Rest for a few seconds and repeat Steps 1 through 7 at least 10 times every 1-2 hours when you are awake. Take your time and take a few normal breaths between deep breaths. 9. The spirometer may include an indicator to show your best effort. Use the indicator as a goal to work toward during each repetition. 10. After each set of 10 deep breaths, practice coughing to be sure your lungs are clear. If you have an incision (the cut made at the time of surgery), support your incision when coughing by placing a pillow or  rolled up towels firmly against it. Once you are able to get out of bed, walk around indoors and cough well. You may stop using the incentive spirometer when instructed by your caregiver.  RISKS AND COMPLICATIONS  Take your time so you do not get dizzy or light-headed.  If you are in pain, you may need to take or ask for pain medication before doing incentive spirometry. It is harder to take a deep breath if you  are having pain. AFTER USE  Rest and breathe slowly and easily.  It can be helpful to keep track of a log of your progress. Your caregiver can provide you with a simple table to help with this. If you are using the spirometer at home, follow these instructions: [place] IF:   You are having difficultly using the spirometer.  You have trouble using the spirometer as often as instructed.  Your pain medication is not giving enough relief while using the spirometer.  You develop fever of 100.5 F (38.1 C) or higher. SEEK IMMEDIATE MEDICAL CARE IF:   You cough up bloody sputum that had not been present before.  You develop fever of 102 F (38.9 C) or greater.  You develop worsening pain at or near the incision site. MAKE SURE YOU:   Understand these instructions.  Will watch your condition.  Will get help right away if you are not doing well or get worse. Document Released: 03/09/2007 Document Revised: 01/19/2012 Document Reviewed: 05/10/2007 [facility] Patient Information 2014 [place], [place].   ________________________________________________________________________

## 2017-02-02 NOTE — Telephone Encounter (Signed)
Spoke with pt he agreed to the pressure change. The order was placed. Nothing further is needed at this time. Reminder has been set for new download

## 2017-02-03 ENCOUNTER — Encounter (HOSPITAL_COMMUNITY)
Admission: RE | Admit: 2017-02-03 | Discharge: 2017-02-03 | Disposition: A | Discharge status: Home / Self Care | Payer: 59 | Source: Ambulatory Visit | Attending: General Surgery | Admitting: General Surgery

## 2017-02-03 ENCOUNTER — Encounter (HOSPITAL_COMMUNITY): Payer: Self-pay

## 2017-02-03 DIAGNOSIS — Q438 Other specified congenital malformations of intestine: Secondary | ICD-10-CM | POA: Diagnosis not present

## 2017-02-03 DIAGNOSIS — Z6841 Body Mass Index (BMI) 40.0 and over, adult: Secondary | ICD-10-CM | POA: Diagnosis not present

## 2017-02-03 DIAGNOSIS — K219 Gastro-esophageal reflux disease without esophagitis: Secondary | ICD-10-CM | POA: Diagnosis not present

## 2017-02-03 DIAGNOSIS — G4730 Sleep apnea, unspecified: Secondary | ICD-10-CM | POA: Diagnosis not present

## 2017-02-03 DIAGNOSIS — Z1211 Encounter for screening for malignant neoplasm of colon: Secondary | ICD-10-CM | POA: Diagnosis not present

## 2017-02-03 HISTORY — DX: Gastro-esophageal reflux disease without esophagitis: K21.9

## 2017-02-03 LAB — CBC WITH DIFFERENTIAL/PLATELET
Basophils Absolute: 0 10*3/uL (ref 0.0–0.1)
Basophils Relative: 0 %
Eosinophils Absolute: 0.1 10*3/uL (ref 0.0–0.7)
Eosinophils Relative: 1 %
HEMATOCRIT: 46.9 % (ref 39.0–52.0)
HEMOGLOBIN: 15.8 g/dL (ref 13.0–17.0)
Lymphocytes Relative: 25 %
Lymphs Abs: 1.4 10*3/uL (ref 0.7–4.0)
MCH: 29.6 pg (ref 26.0–34.0)
MCHC: 33.7 g/dL (ref 30.0–36.0)
MCV: 88 fL (ref 78.0–100.0)
Monocytes Absolute: 0.6 10*3/uL (ref 0.1–1.0)
Monocytes Relative: 11 %
NEUTROS ABS: 3.5 10*3/uL (ref 1.7–7.7)
NEUTROS PCT: 63 %
Platelets: 236 10*3/uL (ref 150–400)
RBC: 5.33 MIL/uL (ref 4.22–5.81)
RDW: 13.3 % (ref 11.5–15.5)
WBC: 5.7 10*3/uL (ref 4.0–10.5)

## 2017-02-03 LAB — COMPREHENSIVE METABOLIC PANEL
ALBUMIN: 4 g/dL (ref 3.5–5.0)
ALK PHOS: 46 U/L (ref 38–126)
ALT: 36 U/L (ref 17–63)
ANION GAP: 7 (ref 5–15)
AST: 34 U/L (ref 15–41)
BILIRUBIN TOTAL: 0.9 mg/dL (ref 0.3–1.2)
BUN: 21 mg/dL — ABNORMAL HIGH (ref 6–20)
CALCIUM: 9.6 mg/dL (ref 8.9–10.3)
CO2: 29 mmol/L (ref 22–32)
CREATININE: 1.03 mg/dL (ref 0.61–1.24)
Chloride: 103 mmol/L (ref 101–111)
GFR calc Af Amer: >60 mL/min (ref >60)
GFR calc non Af Amer: >60 mL/min (ref >60)
GLUCOSE: 102 mg/dL — AB (ref 65–99)
Potassium: 4.6 mmol/L (ref 3.5–5.1)
Sodium: 139 mmol/L (ref 135–145)
TOTAL PROTEIN: 7.7 g/dL (ref 6.5–8.1)

## 2017-02-03 NOTE — Progress Notes (Signed)
Left patient message to bring cpap mask and tubing day of procedure 02-05-17

## 2017-02-03 NOTE — Progress Notes (Signed)
01/21/17 lov epic  01/21/17 cxr epic  ekg 12/22/16 epic

## 2017-02-04 ENCOUNTER — Other Ambulatory Visit: Payer: Self-pay

## 2017-02-04 MED FILL — oxyCODONE HCL 5 MG/5ML SOLN: 5 | 2 days supply | Qty: 100 | Fill #0

## 2017-02-04 MED FILL — PANTOPRAZOLE SOD DR 40 MG T: 40 | 30 days supply | Qty: 30 | Fill #0

## 2017-02-04 NOTE — Anesthesia Preprocedure Evaluation (Addendum)
Anesthesia Evaluation  Patient identified by MRN, date of birth, ID band Patient awake    Reviewed: Allergy & Precautions, NPO status , Patient's Chart, lab work & pertinent test results  Airway Mallampati: I  TM Distance: >3 FB Neck ROM: Full    Dental  (+) Teeth Intact   Pulmonary shortness of breath, sleep apnea ,    breath sounds clear to auscultation       Cardiovascular  Rhythm:Regular Rate:Normal     Neuro/Psych    GI/Hepatic GERD  ,  Endo/Other  Morbid obesity  Renal/GU      Musculoskeletal   Abdominal (+) + obese,   Peds  Hematology   Anesthesia Other Findings   Reproductive/Obstetrics                           Anesthesia Physical Anesthesia Plan  ASA: III  Anesthesia Plan: MAC   Post-op Pain Management:    Induction: Intravenous  Airway Management Planned: Natural Airway  Additional Equipment:   Intra-op Plan:   Post-operative Plan:   Informed Consent: I have reviewed the patients History and Physical, chart, labs and discussed the procedure including the risks, benefits and alternatives for the proposed anesthesia with the patient or authorized representative who has indicated his/her understanding and acceptance.     Plan Discussed with: CRNA  Anesthesia Plan Comments:        Anesthesia Quick Evaluation

## 2017-02-04 NOTE — Progress Notes (Signed)
Patient left voicemail, received message to bring cpap mask and tubing am of procedure 02-05-17

## 2017-02-04 NOTE — Patient Outreach (Signed)
[facility]  02/04/2017  Rob BuntingJames K Sabic 03-Jul-1966 [id number]   Subjective: Telephone call to patient. Discussed UMR pre-op follow up. Patient voices understanding and agrees to pre-op call.    Objective: Per Chart review-patient with morbid obesity due to excess calories, scheduled for Laparoscopic gastric sleeve resection on 02/09/17.     Assessment: Received UMR pre-op call referral. Pre-op call completed.  Re: FMLA-client reports he has not completed FMLA forms. RNCM discussed the benefits of having FMLA in place. Client reported he would follow up on this. [employer] contact information provided.   RNCM discussed Short term/Long term disability benefit and instructed to contact Benefits service office with question. Client reports he believes he chose the [facility] plan. RNCM encouraged client to follow up with the benefits office.    Post procedure equipment/home health needs. Client denies Home health or equipment needs. RNCM reinforced that the inpatient case manager in the hospital will arrange if he had any of these needs prior to discharge.   RNCM discussed [place] benefit is higher when using a [place] facility/pharmacy. Client voiced understanding.    Support:  Client reports he has transportation to his follow up appointment. Client also reports that he has someone to assist with pharmacy needs.     Re: Advanced Directives. RNCM reinforced that Spiritual care department available to assist [employer] employees with Advanced Directives as needed.  No other medical issues identified and no additional community resource information needs at this time.   Patient is agreeable to follow up post procedure call.  Plan: Telephonic RNCM will follow up post discharge within 3 business days of notification.   Kathyrn SheriffJuana Dezire Turk, RN, MSN, [facility],CCM [facility] Community Care Coordinator Cell: [phone]

## 2017-02-05 ENCOUNTER — Encounter (HOSPITAL_COMMUNITY)
Admission: RE | Disposition: A | Discharge status: Home / Self Care | Payer: Self-pay | Source: Ambulatory Visit | Attending: Gastroenterology

## 2017-02-05 ENCOUNTER — Ambulatory Visit (HOSPITAL_COMMUNITY)
Admission: RE | Admit: 2017-02-05 | Discharge: 2017-02-05 | Disposition: A | Discharge status: Home / Self Care | Payer: 59 | Source: Ambulatory Visit | Attending: Gastroenterology | Admitting: Gastroenterology

## 2017-02-05 ENCOUNTER — Encounter (HOSPITAL_COMMUNITY): Payer: Self-pay | Admitting: *Deleted

## 2017-02-05 ENCOUNTER — Ambulatory Visit (HOSPITAL_COMMUNITY): Payer: 59 | Admitting: Anesthesiology

## 2017-02-05 DIAGNOSIS — Z1212 Encounter for screening for malignant neoplasm of rectum: Secondary | ICD-10-CM | POA: Diagnosis not present

## 2017-02-05 DIAGNOSIS — G4730 Sleep apnea, unspecified: Secondary | ICD-10-CM | POA: Diagnosis not present

## 2017-02-05 DIAGNOSIS — Z6841 Body Mass Index (BMI) 40.0 and over, adult: Secondary | ICD-10-CM | POA: Diagnosis not present

## 2017-02-05 DIAGNOSIS — Z1211 Encounter for screening for malignant neoplasm of colon: Secondary | ICD-10-CM | POA: Insufficient documentation

## 2017-02-05 DIAGNOSIS — Q438 Other specified congenital malformations of intestine: Secondary | ICD-10-CM | POA: Insufficient documentation

## 2017-02-05 DIAGNOSIS — R0602 Shortness of breath: Secondary | ICD-10-CM | POA: Diagnosis not present

## 2017-02-05 DIAGNOSIS — K219 Gastro-esophageal reflux disease without esophagitis: Secondary | ICD-10-CM | POA: Diagnosis not present

## 2017-02-05 DIAGNOSIS — G4733 Obstructive sleep apnea (adult) (pediatric): Secondary | ICD-10-CM | POA: Diagnosis not present

## 2017-02-05 HISTORY — PX: COLONOSCOPY WITH PROPOFOL: SHX5780

## 2017-02-05 SURGERY — COLONOSCOPY WITH PROPOFOL
Anesthesia: Monitor Anesthesia Care

## 2017-02-05 MED ORDER — PROPOFOL 10 MG/ML IV BOLUS
INTRAVENOUS | Status: DC | PRN
  Administered 2017-02-05 (×4): 20 mg via INTRAVENOUS

## 2017-02-05 MED ORDER — LIDOCAINE 2% (20 MG/ML) 5 ML SYRINGE
INTRAMUSCULAR | Status: DC | PRN
  Administered 2017-02-05: 100 mg via INTRAVENOUS

## 2017-02-05 MED ORDER — LIDOCAINE 2% (20 MG/ML) 5 ML SYRINGE
INTRAMUSCULAR | Status: AC
  Filled 2017-02-05: qty 5

## 2017-02-05 MED ORDER — PROPOFOL 10 MG/ML IV BOLUS
INTRAVENOUS | Status: AC
  Filled 2017-02-05: qty 20

## 2017-02-05 MED ORDER — LACTATED RINGERS IV SOLN
INTRAVENOUS | Status: DC
  Administered 2017-02-05: 1000 mL via INTRAVENOUS

## 2017-02-05 MED ORDER — PROPOFOL 10 MG/ML IV BOLUS
INTRAVENOUS | Status: AC
  Filled 2017-02-05: qty 60

## 2017-02-05 MED ORDER — PROPOFOL 500 MG/50ML IV EMUL
INTRAVENOUS | Status: DC | PRN
  Administered 2017-02-05: 140 ug/kg/min via INTRAVENOUS

## 2017-02-05 MED ORDER — SODIUM CHLORIDE 0.9 % IV SOLN: INTRAVENOUS | Status: DC

## 2017-02-05 SURGICAL SUPPLY — 21 items

## 2017-02-05 NOTE — Anesthesia Postprocedure Evaluation (Addendum)
Anesthesia Post Note  Patient: Jorge Strong  Procedure(s) Performed: Procedure(s) (LRB): COLONOSCOPY WITH PROPOFOL (N/A)  Patient location during evaluation: Endoscopy Anesthesia Type: MAC Level of consciousness: awake and alert Pain management: pain level controlled Vital Signs Assessment: post-procedure vital signs reviewed and stable Respiratory status: spontaneous breathing, nonlabored ventilation, respiratory function stable and patient connected to nasal cannula oxygen Cardiovascular status: stable and blood pressure returned to baseline Anesthetic complications: no       Last Vitals:  Vitals:   02/05/17 0825 02/05/17 0830  BP:  (!) 148/87  Pulse: 87 94  Resp: 19 12  Temp: 36.6 C     Last Pain:  Vitals:   02/05/17 0825  TempSrc: Oral                 Nashonda Limberg,Teruo TERRILL

## 2017-02-05 NOTE — H&P (Signed)
History:  This patient presents for endoscopic testing for colon cancer screening.  Jorge Strong Referring physician: Lolita PatellaEADE,ROBERT ALEXANDER, MD  Past Medical History: Past Medical History:  Diagnosis Date  . Closed rib fracture    past hx, recent 3 days ago fell left side"? a new left rib fracture"-has not been confirmed.  Marland Kitchen. GERD (gastroesophageal reflux disease)   . H/O seasonal allergies   . Sleep apnea    cpap  . Wound of left leg    left leg wound is healing, but remains draining clear serous-covering over wound.     Past Surgical History: Past Surgical History:  Procedure Laterality Date  . APPENDECTOMY     age 51  . INGUINAL HERNIA REPAIR Right 11/02/2015   Procedure: LAPAROSCOPIC RIGHT INGUINAL HERNIA REPAIR WITH MESH;  Surgeon: Axel FillerArmando Ramirez, MD;  Location: [facility];  Service: General;  Laterality: Right;  . INSERTION OF MESH Right 11/02/2015   Procedure: INSERTION OF MESH;  Surgeon: Axel FillerArmando Ramirez, MD;  Location: [facility];  Service: General;  Laterality: Right;    Allergies: No Known Allergies  Outpatient Meds: Current Facility-Administered Medications  Medication Dose Route Frequency Provider Last Rate Last Dose  . 0.9 %  sodium chloride infusion   Intravenous Continuous Charlie PitterHenry L Danis III, MD      . lactated ringers infusion   Intravenous Continuous Charlie PitterHenry L Danis III, MD 125 mL/hr at 02/05/17 0707 1,000 mL at 02/05/17 0707      ___________________________________________________________________ Objective   Exam:  BP (!) 155/107   Pulse 99   Temp 98.2 F (36.8 C) (Oral)   Resp 13   Ht 6\' 4"  (1.93 m)   Wt (!) 372 lb (168.7 kg)   SpO2 95%   BMI 45.28 kg/m    CV: RRR without murmur, S1/S2, no JVD, no peripheral edema  Resp: clear to auscultation bilaterally, normal RR and effort noted  GI: obese. soft, no tenderness, with active bowel sounds. No guarding or palpable organomegaly noted.  Neuro: awake, alert and oriented x 3. Normal gross motor  function and fluent speech   Assessment:  Average risk colon cancer screening  Plan:  colonoscopy   Charlie PitterHenry L Danis III

## 2017-02-05 NOTE — Op Note (Signed)
[facility] Patient Name: Jorge Strong Procedure Date: 02/05/2017 MRN: [id number] Attending MD: Estill Cotta. Loletha Carrow , MD Date of Birth: [birth year] CSN: [id number] Age: 51 Admit Type: Outpatient Procedure:                Colonoscopy Indications:              Screening for colorectal malignant neoplasm, This                            is the patient's first colonoscopy Providers:                Estill Cotta. Loletha Carrow, MD, Carmie End, RN, Cherylynn Ridges, Technician, Danley Danker, CRNA Referring MD:              Medicines:                Monitored Anesthesia Care Complications:            No immediate complications. Estimated Blood Loss:     Estimated blood loss: none. Procedure:                Pre-Anesthesia Assessment:                           - Prior to the procedure, a History and Physical                            was performed, and patient medications and                            allergies were reviewed. The patient's tolerance of                            previous anesthesia was also reviewed. The risks                            and benefits of the procedure and the sedation                            options and risks were discussed with the patient.                            All questions were answered, and informed consent                            was obtained. Prior Anticoagulants: The patient has                            taken no previous anticoagulant or antiplatelet                            agents. ASA Grade Assessment: III - A patient with  severe systemic disease. After reviewing the risks                            and benefits, the patient was deemed in                            satisfactory condition to undergo the procedure.                           After obtaining informed consent, the colonoscope                            was passed under direct vision. Throughout the      procedure, the patient's blood pressure, pulse, and                            oxygen saturations were monitored continuously. The                            EC-[id number] ([id number]) scope was introduced through                            the anus and advanced to the the cecum, identified                            by appendiceal orifice and ileocecal valve. The                            colonoscopy was extremely difficult due to                            significant looping and the patient's body habitus.                            Successful completion of the procedure was aided by                            changing the patient to a supine position and using                            manual pressure. The patient tolerated the                            procedure well. The quality of the bowel                            preparation was good. The ileocecal valve,                            appendiceal orifice, and rectum were photographed.                            The quality of the bowel preparation was evaluated  using the BBPS [facility] Bowel Preparation Scale)                            with scores of: Right Colon = 2, Transverse Colon =                            2 and Left Colon = 2. The total BBPS score equals                            6. The bowel preparation used was SUPREP. Scope In: 7:39:49 AM Scope Out: 8:08:07 AM Scope Withdrawal Time: 0 hours 7 minutes 52 seconds  Total Procedure Duration: 0 hours 28 minutes 18 seconds  Findings:      The perianal and digital rectal examinations were normal.      The entire examined colon appeared normal on direct and retroflexion       views. Impression:               - The entire examined colon is normal on direct and                            retroflexion views.                           - No specimens collected. Moderate Sedation:      MAC sedation used Recommendation:           - Patient has a contact  number available for                            emergencies. The signs and symptoms of potential                            delayed complications were discussed with the                            patient. Return to normal activities tomorrow.                            Written discharge instructions were provided to the                            patient.                           - Resume previous diet.                           - Continue present medications.                           - Repeat colonoscopy in 10 years for screening                            purposes. Procedure Code(s):        --- Professional ---  45378, Colonoscopy, flexible; diagnostic, including                            collection of specimen(s) by brushing or washing,                            when performed (separate procedure) Diagnosis Code(s):        --- Professional ---                           Z12.11, Encounter for screening for malignant                            neoplasm of colon CPT copyright 2016 American Medical Association. All rights reserved. The codes documented in this report are preliminary and upon coder review may  be revised to meet current compliance requirements. Sanika Brosious L. Loletha Carrow, MD 02/05/2017 8:16:03 AM This report has been signed electronically. Number of Addenda: 0

## 2017-02-05 NOTE — Transfer of Care (Signed)
Immediate Anesthesia Transfer of Care Note  Patient: Jorge Strong  Procedure(s) Performed: Procedure(s): COLONOSCOPY WITH PROPOFOL (N/A)  Patient Location: Endoscopy Unit  Anesthesia Type:MAC  Level of Consciousness: awake and alert   Airway & Oxygen Therapy: Patient Spontanous Breathing and Patient connected to face mask oxygen  Post-op Assessment: Report given to RN and Post -op Vital signs reviewed and stable  Post vital signs: Reviewed and stable  Last Vitals:  Vitals:   02/05/17 0652  BP: (!) 155/107  Pulse: 99  Resp: 13  Temp: 36.8 C    Last Pain:  Vitals:   02/05/17 0652  TempSrc: Oral         Complications: No apparent anesthesia complications

## 2017-02-05 NOTE — Discharge Instructions (Signed)
YOU HAD AN ENDOSCOPIC PROCEDURE TODAY: Refer to the procedure report and other information in the discharge instructions given to you for any specific questions about what was found during the examination. If this information does not answer your questions, please call [place] office at [phone] to clarify.  ° °YOU SHOULD EXPECT: Some feelings of bloating in the abdomen. Passage of more gas than usual. Walking can help get rid of the air that was put into your GI tract during the procedure and reduce the bloating. If you had a lower endoscopy (such as a colonoscopy or flexible sigmoidoscopy) you may notice spotting of blood in your stool or on the toilet paper. Some abdominal soreness may be present for a day or two, also. ° °DIET: Your first meal following the procedure should be a light meal and then it is ok to progress to your normal diet. A half-sandwich or bowl of soup is an example of a good first meal. Heavy or fried foods are harder to digest and may make you feel nauseous or bloated. Drink plenty of fluids but you should avoid alcoholic beverages for 24 hours. If you had a esophageal dilation, please see attached instructions for diet.   ° °ACTIVITY: Your care partner should take you home directly after the procedure. You should plan to take it easy, moving slowly for the rest of the day. You can resume normal activity the day after the procedure however YOU SHOULD NOT DRIVE, use power tools, machinery or perform tasks that involve climbing or major physical exertion for 24 hours (because of the sedation medicines used during the test).  ° °SYMPTOMS TO REPORT IMMEDIATELY: °A gastroenterologist can be reached at any hour. Please call [phone]  for any of the following symptoms:  °Following lower endoscopy (colonoscopy, flexible sigmoidoscopy) °Excessive amounts of blood in the stool  °Significant tenderness, worsening of abdominal pains  °Swelling of the abdomen that is new, acute  °Fever of 100° or  higher  °Following upper endoscopy (EGD, EUS, ERCP, esophageal dilation) °Vomiting of blood or coffee ground material  °New, significant abdominal pain  °New, significant chest pain or pain under the shoulder blades  °Painful or persistently difficult swallowing  °New shortness of breath  °Black, tarry-looking or red, bloody stools ° °FOLLOW UP:  °If any biopsies were taken you will be contacted by phone or by letter within the next 1-3 weeks. Call [phone]  if you have not heard about the biopsies in 3 weeks.  °Please also call with any specific questions about appointments or follow up tests. ° °

## 2017-02-09 ENCOUNTER — Inpatient Hospital Stay (HOSPITAL_COMMUNITY): Payer: 59 | Admitting: Registered Nurse

## 2017-02-09 ENCOUNTER — Encounter (HOSPITAL_COMMUNITY): Payer: Self-pay | Admitting: *Deleted

## 2017-02-09 ENCOUNTER — Inpatient Hospital Stay (HOSPITAL_COMMUNITY)
Admission: RE | Admit: 2017-02-09 | Discharge: 2017-02-10 | DRG: 621 | Disposition: A | Discharge status: Home / Self Care | Payer: 59 | Source: Ambulatory Visit | Attending: General Surgery | Admitting: General Surgery

## 2017-02-09 ENCOUNTER — Encounter (HOSPITAL_COMMUNITY)
Admission: RE | Disposition: A | Discharge status: Home / Self Care | Payer: Self-pay | Source: Ambulatory Visit | Attending: General Surgery

## 2017-02-09 DIAGNOSIS — G4733 Obstructive sleep apnea (adult) (pediatric): Secondary | ICD-10-CM | POA: Diagnosis present

## 2017-02-09 DIAGNOSIS — K219 Gastro-esophageal reflux disease without esophagitis: Secondary | ICD-10-CM | POA: Diagnosis present

## 2017-02-09 DIAGNOSIS — IMO0001 Reserved for inherently not codable concepts without codable children: Secondary | ICD-10-CM

## 2017-02-09 DIAGNOSIS — I10 Essential (primary) hypertension: Secondary | ICD-10-CM | POA: Diagnosis present

## 2017-02-09 DIAGNOSIS — E7800 Pure hypercholesterolemia, unspecified: Secondary | ICD-10-CM | POA: Diagnosis not present

## 2017-02-09 DIAGNOSIS — R0600 Dyspnea, unspecified: Secondary | ICD-10-CM | POA: Diagnosis not present

## 2017-02-09 DIAGNOSIS — Z6841 Body Mass Index (BMI) 40.0 and over, adult: Secondary | ICD-10-CM | POA: Diagnosis not present

## 2017-02-09 DIAGNOSIS — Z79899 Other long term (current) drug therapy: Secondary | ICD-10-CM

## 2017-02-09 DIAGNOSIS — E669 Obesity, unspecified: Secondary | ICD-10-CM | POA: Diagnosis present

## 2017-02-09 HISTORY — PX: UPPER GI ENDOSCOPY: SHX6162

## 2017-02-09 HISTORY — PX: LAPAROSCOPIC GASTRIC SLEEVE RESECTION: SHX5895

## 2017-02-09 LAB — CBC
HCT: 46.5 % (ref 39.0–52.0)
HEMOGLOBIN: 15.3 g/dL (ref 13.0–17.0)
MCH: 29.5 pg (ref 26.0–34.0)
MCHC: 32.9 g/dL (ref 30.0–36.0)
MCV: 89.6 fL (ref 78.0–100.0)
Platelets: 231 10*3/uL (ref 150–400)
RBC: 5.19 MIL/uL (ref 4.22–5.81)
RDW: 13.2 % (ref 11.5–15.5)
WBC: 10.3 10*3/uL (ref 4.0–10.5)

## 2017-02-09 LAB — CREATININE, SERUM
CREATININE: 1.03 mg/dL (ref 0.61–1.24)
GFR calc Af Amer: >60 mL/min (ref >60)
GFR calc non Af Amer: >60 mL/min (ref >60)

## 2017-02-09 LAB — HEMOGLOBIN AND HEMATOCRIT, BLOOD
HCT: 45.2 % (ref 39.0–52.0)
HEMOGLOBIN: 14.9 g/dL (ref 13.0–17.0)

## 2017-02-09 SURGERY — GASTRECTOMY, SLEEVE, LAPAROSCOPIC
Anesthesia: General | Site: Abdomen

## 2017-02-09 MED ORDER — LIDOCAINE 2% (20 MG/ML) 5 ML SYRINGE
INTRAMUSCULAR | Status: AC
  Filled 2017-02-09: qty 5

## 2017-02-09 MED ORDER — ONDANSETRON HCL 4 MG/2ML IJ SOLN: 4.0000 mg | INTRAMUSCULAR | Status: DC | PRN

## 2017-02-09 MED ORDER — ONDANSETRON HCL 4 MG/2ML IJ SOLN
INTRAMUSCULAR | Status: AC
  Filled 2017-02-09: qty 2

## 2017-02-09 MED ORDER — FENTANYL CITRATE (PF) 100 MCG/2ML IJ SOLN
INTRAMUSCULAR | Status: AC
  Filled 2017-02-09: qty 2

## 2017-02-09 MED ORDER — PREMIER PROTEIN SHAKE
2.0000 [oz_av] | ORAL | Status: DC
Start: 2017-02-11 — End: 2017-02-10

## 2017-02-09 MED ORDER — SODIUM CHLORIDE 0.9 % IV SOLN
INTRAVENOUS | Status: DC
  Administered 2017-02-09: 14:00:00 via INTRAVENOUS
  Administered 2017-02-10: 1000 mL via INTRAVENOUS

## 2017-02-09 MED ORDER — BUPIVACAINE LIPOSOME 1.3 % IJ SUSP
INTRAMUSCULAR | Status: DC | PRN
  Administered 2017-02-09: 20 mL

## 2017-02-09 MED ORDER — LIDOCAINE 2% (20 MG/ML) 5 ML SYRINGE
INTRAMUSCULAR | Status: DC | PRN
  Administered 2017-02-09: 100 mg via INTRAVENOUS

## 2017-02-09 MED ORDER — GABAPENTIN 300 MG PO CAPS
300.0000 mg | ORAL_CAPSULE | ORAL | Status: AC
  Administered 2017-02-09: 300 mg via ORAL
  Filled 2017-02-09: qty 1

## 2017-02-09 MED ORDER — PROPOFOL 10 MG/ML IV BOLUS
INTRAVENOUS | Status: AC
  Filled 2017-02-09: qty 20

## 2017-02-09 MED ORDER — OXYCODONE HCL 5 MG/5ML PO SOLN: 5.0000 mg | ORAL | Status: DC | PRN

## 2017-02-09 MED ORDER — BUPIVACAINE HCL 0.25 % IJ SOLN
INTRAMUSCULAR | Status: DC | PRN
  Administered 2017-02-09: 30 mL

## 2017-02-09 MED ORDER — OXYCODONE HCL 5 MG/5ML PO SOLN
5.0000 mg | Freq: Once | ORAL | Status: DC | PRN
  Filled 2017-02-09: qty 5

## 2017-02-09 MED ORDER — PANTOPRAZOLE SODIUM 40 MG IV SOLR
40.0000 mg | Freq: Every day | INTRAVENOUS | Status: DC
  Administered 2017-02-09: 40 mg via INTRAVENOUS
  Filled 2017-02-09: qty 40

## 2017-02-09 MED ORDER — SUGAMMADEX SODIUM 500 MG/5ML IV SOLN
INTRAVENOUS | Status: DC | PRN
  Administered 2017-02-09: 350 mg via INTRAVENOUS

## 2017-02-09 MED ORDER — ONDANSETRON HCL 4 MG/2ML IJ SOLN: 4.0000 mg | Freq: Four times a day (QID) | INTRAMUSCULAR | Status: DC | PRN

## 2017-02-09 MED ORDER — BUPIVACAINE LIPOSOME 1.3 % IJ SUSP
20.0000 mL | Freq: Once | INTRAMUSCULAR | Status: DC
  Filled 2017-02-09: qty 20

## 2017-02-09 MED ORDER — ROCURONIUM BROMIDE 10 MG/ML (PF) SYRINGE
PREFILLED_SYRINGE | INTRAVENOUS | Status: DC | PRN
  Administered 2017-02-09: 40 mg via INTRAVENOUS
  Administered 2017-02-09: 10 mg via INTRAVENOUS
  Administered 2017-02-09: 20 mg via INTRAVENOUS
  Administered 2017-02-09: 10 mg via INTRAVENOUS

## 2017-02-09 MED ORDER — BUPIVACAINE HCL (PF) 0.25 % IJ SOLN
INTRAMUSCULAR | Status: AC
  Filled 2017-02-09: qty 30

## 2017-02-09 MED ORDER — ENOXAPARIN SODIUM 30 MG/0.3ML ~~LOC~~ SOLN
30.0000 mg | Freq: Two times a day (BID) | SUBCUTANEOUS | Status: DC
  Administered 2017-02-10: 30 mg via SUBCUTANEOUS
  Filled 2017-02-09: qty 0.3

## 2017-02-09 MED ORDER — FENTANYL CITRATE (PF) 100 MCG/2ML IJ SOLN
25.0000 ug | INTRAMUSCULAR | Status: DC | PRN
  Administered 2017-02-09: 50 ug via INTRAVENOUS

## 2017-02-09 MED ORDER — 0.9 % SODIUM CHLORIDE (POUR BTL) OPTIME
TOPICAL | Status: DC | PRN
  Administered 2017-02-09: 1000 mL

## 2017-02-09 MED ORDER — CEFOTETAN DISODIUM-DEXTROSE 2-2.08 GM-% IV SOLR
2.0000 g | INTRAVENOUS | Status: AC
  Administered 2017-02-09: 2 g via INTRAVENOUS
  Filled 2017-02-09: qty 50

## 2017-02-09 MED ORDER — LACTATED RINGERS IV SOLN
INTRAVENOUS | Status: DC
  Administered 2017-02-09: 09:00:00 via INTRAVENOUS

## 2017-02-09 MED ORDER — FENTANYL CITRATE (PF) 100 MCG/2ML IJ SOLN
INTRAMUSCULAR | Status: DC | PRN
Start: 2017-02-09 — End: 2017-02-09
  Administered 2017-02-09: 50 ug via INTRAVENOUS
  Administered 2017-02-09: 100 ug via INTRAVENOUS
  Administered 2017-02-09: 50 ug via INTRAVENOUS

## 2017-02-09 MED ORDER — APREPITANT 40 MG PO CAPS
40.0000 mg | ORAL_CAPSULE | ORAL | Status: AC
  Administered 2017-02-09: 40 mg via ORAL
  Filled 2017-02-09: qty 1

## 2017-02-09 MED ORDER — SUGAMMADEX SODIUM 500 MG/5ML IV SOLN
INTRAVENOUS | Status: AC
Start: 2017-02-09 — End: 2017-02-09
  Filled 2017-02-09: qty 5

## 2017-02-09 MED ORDER — CELECOXIB 200 MG PO CAPS
400.0000 mg | ORAL_CAPSULE | ORAL | Status: AC
  Administered 2017-02-09: 400 mg via ORAL
  Filled 2017-02-09: qty 2

## 2017-02-09 MED ORDER — MIDAZOLAM HCL 2 MG/2ML IJ SOLN
INTRAMUSCULAR | Status: AC
  Filled 2017-02-09: qty 2

## 2017-02-09 MED ORDER — LACTATED RINGERS IR SOLN
Status: DC | PRN
  Administered 2017-02-09: 1000 mL

## 2017-02-09 MED ORDER — ACETAMINOPHEN 500 MG PO TABS
1000.0000 mg | ORAL_TABLET | ORAL | Status: AC
  Administered 2017-02-09: 1000 mg via ORAL
  Filled 2017-02-09: qty 2

## 2017-02-09 MED ORDER — PROPOFOL 10 MG/ML IV BOLUS
INTRAVENOUS | Status: DC | PRN
  Administered 2017-02-09: 250 mg via INTRAVENOUS

## 2017-02-09 MED ORDER — ACETAMINOPHEN 160 MG/5ML PO SOLN: 325.0000 mg | ORAL | Status: DC | PRN

## 2017-02-09 MED ORDER — OXYCODONE HCL 5 MG PO TABS: 5.0000 mg | ORAL_TABLET | Freq: Once | ORAL | Status: DC | PRN

## 2017-02-09 MED ORDER — ONDANSETRON HCL 4 MG/2ML IJ SOLN
INTRAMUSCULAR | Status: DC | PRN
  Administered 2017-02-09: 4 mg via INTRAVENOUS

## 2017-02-09 MED ORDER — MORPHINE SULFATE (PF) 4 MG/ML IV SOLN
1.0000 mg | INTRAVENOUS | Status: DC | PRN
  Administered 2017-02-09: 1 mg via INTRAVENOUS
  Filled 2017-02-09: qty 1

## 2017-02-09 MED ORDER — ROCURONIUM BROMIDE 50 MG/5ML IV SOSY
PREFILLED_SYRINGE | INTRAVENOUS | Status: AC
  Filled 2017-02-09: qty 5

## 2017-02-09 MED ORDER — ACETAMINOPHEN 325 MG PO TABS: 650.0000 mg | ORAL_TABLET | ORAL | Status: DC | PRN

## 2017-02-09 MED ORDER — HYDROMORPHONE HCL 1 MG/ML IJ SOLN: 0.2500 mg | INTRAMUSCULAR | Status: DC | PRN

## 2017-02-09 MED ORDER — DEXAMETHASONE SODIUM PHOSPHATE 10 MG/ML IJ SOLN
INTRAMUSCULAR | Status: DC | PRN
  Administered 2017-02-09: 10 mg via INTRAVENOUS

## 2017-02-09 MED ORDER — HEPARIN SODIUM (PORCINE) 5000 UNIT/ML IJ SOLN
5000.0000 [IU] | INTRAMUSCULAR | Status: AC
  Administered 2017-02-09: 5000 [IU] via SUBCUTANEOUS
  Filled 2017-02-09: qty 1

## 2017-02-09 MED ORDER — MIDAZOLAM HCL 5 MG/5ML IJ SOLN
INTRAMUSCULAR | Status: DC | PRN
  Administered 2017-02-09: 2 mg via INTRAVENOUS

## 2017-02-09 MED ORDER — SUCCINYLCHOLINE CHLORIDE 200 MG/10ML IV SOSY
PREFILLED_SYRINGE | INTRAVENOUS | Status: DC | PRN
Start: 2017-02-09 — End: 2017-02-09
  Administered 2017-02-09: 140 mg via INTRAVENOUS

## 2017-02-09 MED ORDER — SUCCINYLCHOLINE CHLORIDE 200 MG/10ML IV SOSY
PREFILLED_SYRINGE | INTRAVENOUS | Status: AC
  Filled 2017-02-09: qty 10

## 2017-02-09 SURGICAL SUPPLY — 60 items
APPLIER CLIP 5 13 M/L LIGAMAX5 (MISCELLANEOUS)
APPLIER CLIP ROT 10 11.4 M/L (STAPLE)
APPLIER CLIP ROT 13.4 12 LRG (CLIP)
BAG LAPAROSCOPIC 12 15 PORT 16 (BASKET) ×2 IMPLANT
BAG RETRIEVAL 12/15 (BASKET) ×3
BAG RETRIEVAL 12/15MM (BASKET) ×1
BANDAGE ADH SHEER 1  50/CT (GAUZE/BANDAGES/DRESSINGS) ×24 IMPLANT
BENZOIN TINCTURE PRP APPL 2/3 (GAUZE/BANDAGES/DRESSINGS) ×4 IMPLANT
BLADE SURG SZ11 CARB STEEL (BLADE) ×4 IMPLANT
CABLE HIGH FREQUENCY MONO STRZ (ELECTRODE) ×4 IMPLANT
CHLORAPREP W/TINT 26ML (MISCELLANEOUS) ×4 IMPLANT
CLIP APPLIE 5 13 M/L LIGAMAX5 (MISCELLANEOUS) IMPLANT
CLIP APPLIE ROT 10 11.4 M/L (STAPLE) IMPLANT
CLIP APPLIE ROT 13.4 12 LRG (CLIP) IMPLANT
CLOSURE WOUND 1/2 X4 (GAUZE/BANDAGES/DRESSINGS) ×1
COVER SURGICAL LIGHT HANDLE (MISCELLANEOUS) ×4 IMPLANT
DRAIN CHANNEL 19F RND (DRAIN) IMPLANT
ELECT REM PT RETURN 15FT ADLT (MISCELLANEOUS) ×4 IMPLANT
EVACUATOR SILICONE 100CC (DRAIN) IMPLANT
GAUZE SPONGE 4X4 12PLY STRL (GAUZE/BANDAGES/DRESSINGS) IMPLANT
GLOVE BIOGEL PI IND STRL 7.0 (GLOVE) ×2 IMPLANT
GLOVE BIOGEL PI INDICATOR 7.0 (GLOVE) ×2
GLOVE SURG SS PI 7.0 STRL IVOR (GLOVE) ×4 IMPLANT
GOWN STRL REUS W/TWL LRG LVL3 (GOWN DISPOSABLE) ×4 IMPLANT
GOWN STRL REUS W/TWL XL LVL3 (GOWN DISPOSABLE) ×16 IMPLANT
GRASPER SUT TROCAR 14GX15 (MISCELLANEOUS) ×4 IMPLANT
HANDLE STAPLE EGIA 4 XL (STAPLE) ×4 IMPLANT
HOVERMATT SINGLE USE (MISCELLANEOUS) ×4 IMPLANT
KIT BASIN OR (CUSTOM PROCEDURE TRAY) ×4 IMPLANT
MARKER SKIN DUAL TIP RULER LAB (MISCELLANEOUS) ×4 IMPLANT
NEEDLE SPNL 22GX3.5 QUINCKE BK (NEEDLE) ×4 IMPLANT
PACK CARDIOVASCULAR III (CUSTOM PROCEDURE TRAY) ×4 IMPLANT
RELOAD EGIA 45 MED/THCK PURPLE (STAPLE) IMPLANT
RELOAD TRI 45 ART MED THCK BLK (STAPLE) IMPLANT
RELOAD TRI 45 ART MED THCK PUR (STAPLE) IMPLANT
RELOAD TRI 60 ART MED THCK BLK (STAPLE) ×12 IMPLANT
RELOAD TRI 60 ART MED THCK PUR (STAPLE) ×4 IMPLANT
SCISSORS LAP 5X45 EPIX DISP (ENDOMECHANICALS) ×4 IMPLANT
SET IRRIG TUBING LAPAROSCOPIC (IRRIGATION / IRRIGATOR) ×4 IMPLANT
SHEARS HARMONIC ACE PLUS 45CM (MISCELLANEOUS) ×4 IMPLANT
SLEEVE GASTRECTOMY 40FR VISIGI (MISCELLANEOUS) ×4 IMPLANT
SLEEVE XCEL OPT CAN 5 100 (ENDOMECHANICALS) ×8 IMPLANT
SOLUTION ANTI FOG 6CC (MISCELLANEOUS) ×4 IMPLANT
SPONGE LAP 18X18 X RAY DECT (DISPOSABLE) ×4 IMPLANT
STRIP CLOSURE SKIN 1/2X4 (GAUZE/BANDAGES/DRESSINGS) ×3 IMPLANT
SUT ETHIBOND 0 36 GRN (SUTURE) IMPLANT
SUT ETHILON 2 0 PS N (SUTURE) IMPLANT
SUT MNCRL AB 4-0 PS2 18 (SUTURE) ×4 IMPLANT
SUT SILK 0 SH 30 (SUTURE) IMPLANT
SUT VICRYL 0 TIES 12 18 (SUTURE) ×4 IMPLANT
SYR 20CC LL (SYRINGE) ×4 IMPLANT
SYR 50ML LL SCALE MARK (SYRINGE) ×4 IMPLANT
TOWEL OR 17X26 10 PK STRL BLUE (TOWEL DISPOSABLE) ×4 IMPLANT
TOWEL OR NON WOVEN STRL DISP B (DISPOSABLE) ×4 IMPLANT
TROCAR BLADELESS 15MM (ENDOMECHANICALS) ×4 IMPLANT
TROCAR BLADELESS OPT 5 100 (ENDOMECHANICALS) ×4 IMPLANT
TUBING CONNECTING 10 (TUBING) ×3 IMPLANT
TUBING CONNECTING 10' (TUBING) ×1
TUBING ENDO SMARTCAP (MISCELLANEOUS) ×4 IMPLANT
TUBING INSUF HEATED (TUBING) ×4 IMPLANT

## 2017-02-09 NOTE — Anesthesia Procedure Notes (Signed)
Procedure Name: Intubation Date/Time: 02/09/2017 9:58 AM Performed by: Carleene Cooper A Pre-anesthesia Checklist: Patient identified, Emergency Drugs available, Suction available, Patient being monitored and Timeout performed Patient Re-evaluated:Patient Re-evaluated prior to inductionOxygen Delivery Method: Circle system utilized Preoxygenation: Pre-oxygenation with 100% oxygen Intubation Type: IV induction Ventilation: Mask ventilation without difficulty and Oral airway inserted - appropriate to patient size Laryngoscope Size: Mac and 4 Grade View: Grade I Tube type: Oral Tube size: 7.5 mm Number of attempts: 1 Airway Equipment and Method: Stylet Placement Confirmation: ETT inserted through vocal cords under direct vision,  positive ETCO2 and breath sounds checked- equal and bilateral Secured at: 23 cm Tube secured with: Tape Dental Injury: Teeth and Oropharynx as per pre-operative assessment

## 2017-02-09 NOTE — H&P (Signed)
History of Present Illness Jorge Carl MD; 01/23/2017 10:46 AM) The patient is a 51 year old male who presents for a bariatric surgery evaluation. Patient has completed all necessary workup in the preoperative phase for bariatric surgery. The patient is now ready to proceed with surgery. The patient has made moderate improvements in her diet and is exercising well. He was diagnosed with OSA and will get his CPAP on Monday.    Allergies Timmothy Euler, [place]; 01/23/2017 10:27 AM) No Known Drug Allergies 08/30/2015  Medication History Timmothy Euler, [place]; 01/23/2017 10:27 AM) Viagra (  Tablet, Oral as needed) Active. ZyrTEC Allergy (  Capsule, Oral) Active. Nasonex (50MCG/ACT Suspension, Nasal) Active. Medications Reconciled    Review of Systems Jorge Carl MD; 01/23/2017 10:47 AM) General Not Present- Appetite Loss, Chills, Fatigue, Fever, Night Sweats, Weight Gain and Weight Loss. Skin Not Present- Change in Wart/Mole, Dryness, Hives, Jaundice, New Lesions, Non-Healing Wounds, Rash and Ulcer. HEENT Present- Seasonal Allergies and Wears glasses/contact lenses. Not Present- Earache, Hearing Loss, Hoarseness, Nose Bleed, Oral Ulcers, Ringing in the Ears, Sinus Pain, Sore Throat, Visual Disturbances and Yellow Eyes. Respiratory Present- Chronic Cough. Not Present- Bloody sputum, Difficulty Breathing, Snoring and Wheezing. Breast Not Present- Breast Mass, Breast Pain, Nipple Discharge and Skin Changes. Cardiovascular Not Present- Chest Pain, Difficulty Breathing Lying Down, Leg Cramps, Palpitations, Rapid Heart Rate, Shortness of Breath and Swelling of Extremities. Gastrointestinal Not Present- Abdominal Pain, Bloating, Bloody Stool, Change in Bowel Habits, Chronic diarrhea, Constipation, Difficulty Swallowing, Excessive gas, Gets full quickly at meals, Hemorrhoids, Indigestion, Nausea, Rectal Pain and Vomiting. Male Genitourinary Not Present- Blood in Urine, Change in  Urinary Stream, Frequency, Impotence, Nocturia, Painful Urination, Urgency and Urine Leakage. Musculoskeletal Not Present- Back Pain, Joint Pain, Joint Stiffness, Muscle Pain, Muscle Weakness and Swelling of Extremities. Neurological Not Present- Decreased Memory, Fainting, Headaches, Numbness, Seizures, Tingling, Tremor, Trouble walking and Weakness. Psychiatric Present- Anxiety and Depression. Not Present- Bipolar, Change in Sleep Pattern, Fearful and Frequent crying. Endocrine Not Present- Cold Intolerance, Excessive Hunger, Hair Changes, Heat Intolerance and New Diabetes. Hematology Not Present- Easy Bruising, Excessive bleeding, Gland problems, HIV and Persistent Infections.  Vitals Barron Alvine Bradford CMA; 01/23/2017 10:27 AM) 01/23/2017 10:27 AM Weight: 393.6 lb Height: 77in Body Surface Area: 2.98 m Body Mass Index: 46.67 kg/m  Temp.: 98.75F  Pulse: 91 (Regular)  BP: 142/88 (Sitting, Left Arm, Standard)       Physical Exam Jorge Carl MD; 01/23/2017 10:47 AM) General Mental Status-Alert. General Appearance-Cooperative. Orientation-Oriented X4. Build & Nutrition-Obese. Posture-Normal posture.  Integumentary Global Assessment Upon inspection and palpation of skin surfaces of the - Head/Face: no rashes, ulcers, lesions or evidence of photo damage. No palpable nodules or masses and Neck: no visible lesions or palpable masses.  Head and Neck Head-normocephalic, atraumatic with no lesions or palpable masses. Face Global Assessment - atraumatic. Thyroid Gland Characteristics - normal size and consistency.  Eye Eyeball - Bilateral-Extraocular movements intact. Sclera/Conjunctiva - Bilateral-No scleral icterus, No Discharge.  ENMT Nose and Sinuses External Inspection of the Nose - no deformities observed, no swelling present.  Chest and Lung Exam Palpation Palpation of the chest reveals - Non-tender. Auscultation Breath sounds -  Normal.  Cardiovascular Auscultation Rhythm - Regular. Heart Sounds - S1 WNL and S2 WNL. Carotid arteries - No Carotid bruit.  Abdomen Inspection Inspection of the abdomen reveals - No Visible peristalsis, No Abnormal pulsations and No Paradoxical movements. Palpation/Percussion Palpation and Percussion of the abdomen reveal - Soft, Non Tender, No Rebound tenderness, No  Rigidity (guarding), No hepatosplenomegaly and No Palpable abdominal masses.  Peripheral Vascular Upper Extremity Palpation - Pulses bilaterally normal. Lower Extremity Palpation - Edema - Bilateral - No edema. Note: trace edema and discoloration of ankles   Neurologic Neurologic evaluation reveals -normal sensation and normal coordination.  Neuropsychiatric Mental status exam performed with findings of-able to articulate well with normal speech/language, rate, volume and coherence and thought content normal with ability to perform basic computations and apply abstract reasoning.  Musculoskeletal Normal Exam - Bilateral-Upper Extremity Strength Normal and Lower Extremity Strength Normal.    Assessment & Plan Jorge Carl MD; 01/23/2017 10:48 AM) MORBID OBESITY (E66.01) Story: 51 yo male most interested in sleeve. I think this is a good choice for him. He has completed all preop requirements and is ready to undergo the procedure. Impression: We again discussed the details of the operation: Will be done under general anesthesia with an endotracheal tube, that it will be a laparoscopic procedure with 6 small incisions large one being 1.5-2in, that we will remove remove the short gastrics and other vessels to the greater curve of the stomach, place a large Bougie down the stomach and then remove a percent of the stomach using a linear stapler. We discussed the procedure will take approximately 1.5-2 hours. We discussed risks of VTE, staple line leak, skin infection, sleeve stenosis, incisional hernia, need  for open procedure, and postoperative nausea and vomiting. Current Plans Started OxyCODONE HCl /5ML, 5-10 Milliliter every four hours, as needed, 100 Milliliter, 01/23/2017, No Refill. Started Protonix , 1 (one) Tablet daily, #30, 01/23/2017, Ref. x2. Started Zofran ODT , 1 (one) Tablet every six hours, as needed. Local Order: As needed for nasuea SLEEP APNEA (G47.30) Impression: will get CPAP on 3/19

## 2017-02-09 NOTE — Anesthesia Postprocedure Evaluation (Signed)
Anesthesia Post Note  Patient: Jorge Strong  Procedure(s) Performed: Procedure(s) (LRB): LAPAROSCOPIC GASTRIC SLEEVE RESECTION, UPPER ENDO (N/A) UPPER GI ENDOSCOPY  Patient location during evaluation: PACU Anesthesia Type: General Level of consciousness: awake and alert Pain management: pain level controlled Vital Signs Assessment: post-procedure vital signs reviewed and stable Respiratory status: spontaneous breathing, nonlabored ventilation, respiratory function stable and patient connected to nasal cannula oxygen Cardiovascular status: blood pressure returned to baseline and stable Postop Assessment: no signs of nausea or vomiting Anesthetic complications: no       Last Vitals:  Vitals:   02/09/17 1530 02/09/17 1630  BP: (!) 182/103 (!) 185/82  Pulse: 95 90  Resp: 17 18  Temp: 36.6 C 36.7 C    Last Pain:  Vitals:   02/09/17 1630  TempSrc: Oral  PainSc:                  Rafik Koppel S

## 2017-02-09 NOTE — Op Note (Signed)
**Note Jorge-Identified via Obfuscation** Preop Diagnosis: Obesity Class III  Postop Diagnosis: same  Procedure performed: laparoscopic Sleeve Gastrectomy  Assitant: Gaynelle Adu  Indications:  The patient is a 51 y.o. year-old morbidly obese male who has been followed in the Bariatric Clinic as an outpatient. This patient was diagnosed with morbid obesity with a BMI of Body mass index is 45.99 kg/m. and significant co-morbidities including hypertension and sleep apnea.  The patient was counseled extensively in the Bariatric Outpatient Clinic and after a thorough explanation of the risks and benefits of surgery (including death from complications, bowel leak, infection such as peritonitis and/or sepsis, internal hernia, bleeding, need for blood transfusion, bowel obstruction, organ failure, pulmonary embolus, deep venous thrombosis, wound infection, incisional hernia, skin breakdown, and others entailed on the consent form) and after a compliant diet and exercise program, the patient was scheduled for an elective laparoscopic sleeve gastrectomy.  Description of Operation:  Following informed consent, the patient was taken to the operating room and placed on the operating table in the supine position.  He had previously received prophylactic antibiotics and subcutaneous heparin for DVT prophylaxis in the pre-op holding area.  After induction of general endotracheal anesthesia by the anesthesiologist, the patient underwent placement of sequential compression devices, Foley catheter and an oro-gastric tube.  A timeout was confirmed by the surgery and anesthesia teams.  The patient was adequately padded at all pressure points and placed on a footboard to prevent slippage from the OR table during extremes of position during surgery.  He underwent a routine sterile prep and drape of her entire abdomen.    Next, A transverse incision was made under the left subcostal area and a 5mm optical viewing trocar was introduced into the peritoneal cavity.  Pneumoperitoneum was applied with a high flow and low pressure. A laparoscope was inserted to confirm placement. A extraperitoneal block was then placed at the lateral abdominal wall using exparel diluted with marcaine. 5 additional trocars were placed: 1 5mm trocar to the left of the midline. 1 additional 5mm trocar in the left lateral area, 1 12mm trocar in the right mid abdomen, and 1 5mm trocar in the right subcostal area.  The fat pad at the GE junction was incised and the gastrodiaphragmatic ligament was divided using the Harmonic scalpel. Next, a hole was created through the lesser omentum along the greater curve of the stomach to enter the lesser sac. The vessels along the greater omentum were  Then ligated and divided using the Harmonic scalpel moving towards the spleen and then short gastric vessels were ligated and divided in the same fashion to fully mobilize the fundus. The left crus was identified to ensure completion of the dissection. Next the antrum was measured and dissection continued inferiorly along the greater curve towards the pylorus and stopped 6cm from the pylorus.   A 40Fr ViSiGi dilator was placed into the esophgaus and along the lesser curve of the stomach and placed on suction. A 60mm 4-66mm tristapler was used to begin the resection along the antrum being sure to stay well away from the angularis by angling the jaws of the stapler towards the greater curve. An additional 60mm 4-3mm tristapler was used to continue the dissection. Then 1 60mm 3-60mm tristapler loads was used followed by additional 60mm 4-81mm tristaplers were used to complete the resection staying along the ViSiGi and ensuring the fundus was not retained by appropriately retracting it lateral. The initial 3-30mm stapler was difficult to fire and only fired for 40mm. The staple  line was inspected and appeared intact. Air was inserted through the ViSiGi to perform a leak test showing no bubbles and a neutral lie of the  stomach.   The assistant then went and performed an upper endoscopy and leak test. No bubbles were seen and the sleeve and antrum distended appropriately. The specimen was then placed in an endocatch bag and removed by the 15mm port. The fascia of the 15mm port was closed with a 0 vicryl by suture passer. Hemostasis was ensured. Pneumoperitoneum was evacuated, all ports were removed and all incisions closed with 4-0 monocryl suture in subcuticular fashion. Glue was put in place for dressing. The patient awoke from anesthesia and was brought to pacu in stable condition. All counts were correct.  .       Estimated blood loss: <49ml  Specimens:  Sleeve gastrectomy  Local Anesthesia: 50 ml Exparel:0.5% Marcaine mix  Post-Op Plan:       Pain Management: PO, prn      Antibiotics: Prophylactic      Anticoagulation: Prophylactic, Starting now      Post Op Studies/Consults: Not applicable      Intended Discharge: within 48h      Intended Outpatient Follow-Up: Two Week      Intended Outpatient Studies: Not Applicable      Other: Not Applicable   Jorge Strong

## 2017-02-09 NOTE — Interval H&P Note (Signed)
History and Physical Interval Note:  02/09/2017 9:32 AM  Jorge Strong  has presented today for surgery, with the diagnosis of Morbid Obesity, Hypercholesterolemia  The various methods of treatment have been discussed with the patient and family. After consideration of risks, benefits and other options for treatment, the patient has consented to  Procedure(s): LAPAROSCOPIC GASTRIC SLEEVE RESECTION, UPPER ENDO (N/A) as a surgical intervention .  The patient's history has been reviewed, patient examined, no change in status, stable for surgery.  I have reviewed the patient's chart and labs.  Questions were answered to the patient's satisfaction.    He underwent colonoscopy that was negative and has successfully received his CPAP machine.  De Blanch Kinsinger

## 2017-02-09 NOTE — Anesthesia Preprocedure Evaluation (Addendum)
Anesthesia Evaluation  Patient identified by MRN, date of birth, ID band Patient awake    Reviewed: Allergy & Precautions, H&P , NPO status , Patient's Chart, lab work & pertinent test results  Airway Mallampati: II   Neck ROM: full    Dental   Pulmonary shortness of breath, sleep apnea ,    breath sounds clear to auscultation       Cardiovascular negative cardio ROS   Rhythm:regular Rate:Normal     Neuro/Psych    GI/Hepatic GERD  ,  Endo/Other  Morbid obesity  Renal/GU      Musculoskeletal   Abdominal   Peds  Hematology   Anesthesia Other Findings   Reproductive/Obstetrics                             Anesthesia Physical Anesthesia Plan  ASA: II  Anesthesia Plan: General   Post-op Pain Management:    Induction: Intravenous  Airway Management Planned: Oral ETT  Additional Equipment:   Intra-op Plan:   Post-operative Plan: Extubation in OR  Informed Consent: I have reviewed the patients History and Physical, chart, labs and discussed the procedure including the risks, benefits and alternatives for the proposed anesthesia with the patient or authorized representative who has indicated his/her understanding and acceptance.     Plan Discussed with: CRNA, Anesthesiologist and Surgeon  Anesthesia Plan Comments:         Anesthesia Quick Evaluation

## 2017-02-09 NOTE — Progress Notes (Signed)
Patient refuses nocturnal CPAP. Education provided to him and his wife.

## 2017-02-09 NOTE — Transfer of Care (Signed)
Immediate Anesthesia Transfer of Care Note  Patient: Jorge Strong  Procedure(s) Performed: Procedure(s): LAPAROSCOPIC GASTRIC SLEEVE RESECTION, UPPER ENDO (N/A) UPPER GI ENDOSCOPY  Patient Location: PACU  Anesthesia Type:General  Level of Consciousness: awake, alert , oriented and patient cooperative  Airway & Oxygen Therapy: Patient Spontanous Breathing and Patient connected to face mask oxygen  Post-op Assessment: Report given to RN, Post -op Vital signs reviewed and stable and Patient moving all extremities  Post vital signs: Reviewed and stable  Last Vitals:  Vitals:   02/09/17 0732  BP: (!) 148/85  Pulse: 86  Resp: 20  Temp: 36.4 C    Last Pain:  Vitals:   02/09/17 0741  TempSrc:   PainSc: 0-No pain      Patients Stated Pain Goal: 3 (02/09/17 0741)  Complications: No apparent anesthesia complications

## 2017-02-09 NOTE — Op Note (Signed)
SACRAMENTO MONDS [id number] 11-13-1965 02/09/2017  Preoperative diagnosis: morbid obesity  Postoperative diagnosis: Same   Procedure: upper endoscopy   Surgeon: Mary Sella. Destini Cambre M.D., FACS   Anesthesia: Gen.   Indications for procedure: 51 y.o. year old male undergoing Laparoscopic Gastric Sleeve Resection and an EGD was requested to evaluate the new gastric sleeve.   Description of procedure: After we have completed the sleeve resection, I scrubbed out and obtained the Olympus endoscope. I gently placed endoscope in the patient's oropharynx and gently glided it down the esophagus without any difficulty under direct visualization. Once I was in the gastric sleeve, I insufflated the stomach with air. I was able to cannulate and advanced the scope through the gastric sleeve. I was able to cannulate the duodenum with ease. Dr. Sheliah Hatch had placed saline in the upper abdomen. Upon further insufflation of the gastric sleeve there was no evidence of bubbles. GE junction located at 51 cm.  Upon further inspection of the gastric sleeve, the mucosa appeared normal. There is no evidence of any mucosal abnormality. Very small outpouching/remaining fundus.  The sleeve was widely patent at the angularis. There was no evidence of bleeding. The gastric sleeve was decompressed. The scope was withdrawn. The patient tolerated this portion of the procedure well. Please see Dr Guerry Minors operative note for details regarding the laparoscopic gastric sleeve resection.   Mary Sella. Andrey Campanile, MD, FACS  General, Bariatric, & Minimally Invasive Surgery  [facility], [place]

## 2017-02-10 ENCOUNTER — Other Ambulatory Visit: Payer: Self-pay | Admitting: General Surgery

## 2017-02-10 LAB — COMPREHENSIVE METABOLIC PANEL
ALK PHOS: 43 U/L (ref 38–126)
ALT: 35 U/L (ref 17–63)
ANION GAP: 6 (ref 5–15)
AST: 35 U/L (ref 15–41)
Albumin: 3.7 g/dL (ref 3.5–5.0)
BILIRUBIN TOTAL: 0.7 mg/dL (ref 0.3–1.2)
BUN: 12 mg/dL (ref 6–20)
CALCIUM: 8.9 mg/dL (ref 8.9–10.3)
CO2: 28 mmol/L (ref 22–32)
CREATININE: 0.86 mg/dL (ref 0.61–1.24)
Chloride: 106 mmol/L (ref 101–111)
Glucose, Bld: 126 mg/dL — ABNORMAL HIGH (ref 65–99)
Potassium: 4.1 mmol/L (ref 3.5–5.1)
SODIUM: 140 mmol/L (ref 135–145)
TOTAL PROTEIN: 6.6 g/dL (ref 6.5–8.1)

## 2017-02-10 LAB — CBC WITH DIFFERENTIAL/PLATELET
Basophils Absolute: 0 10*3/uL (ref 0.0–0.1)
Basophils Relative: 0 %
EOS ABS: 0 10*3/uL (ref 0.0–0.7)
Eosinophils Relative: 0 %
HCT: 45.4 % (ref 39.0–52.0)
HEMOGLOBIN: 15.3 g/dL (ref 13.0–17.0)
LYMPHS ABS: 1.4 10*3/uL (ref 0.7–4.0)
LYMPHS PCT: 14 %
MCH: 30.1 pg (ref 26.0–34.0)
MCHC: 33.7 g/dL (ref 30.0–36.0)
MCV: 89.2 fL (ref 78.0–100.0)
Monocytes Absolute: 0.6 10*3/uL (ref 0.1–1.0)
Monocytes Relative: 6 %
NEUTROS PCT: 80 %
Neutro Abs: 8.6 10*3/uL — ABNORMAL HIGH (ref 1.7–7.7)
Platelets: 252 10*3/uL (ref 150–400)
RBC: 5.09 MIL/uL (ref 4.22–5.81)
RDW: 13 % (ref 11.5–15.5)
WBC: 10.6 10*3/uL — ABNORMAL HIGH (ref 4.0–10.5)

## 2017-02-10 MED ORDER — ONDANSETRON 4 MG PO TBDP: 4.0000 mg | ORAL_TABLET | Freq: Three times a day (TID) | ORAL | 0 refills | Status: DC | PRN

## 2017-02-10 MED FILL — ONDANSETRON ODT 4 MG TABLET: 4 | 5 days supply | Qty: 20 | Fill #0

## 2017-02-10 NOTE — Progress Notes (Signed)
  Progress Note: Metabolic and Bariatric Surgery Service   Subjective: Doing well, minimal pain, ambulating well  Objective: Vital signs in last 24 hours: Temp:  [97.5 F (36.4 C)-98.8 F (37.1 C)] 97.7 F (36.5 C) (04/03 0535) Pulse Rate:  [80-108] 90 (04/03 0535) Resp:  [12-21] 18 (04/03 0535) BP: (135-185)/(58-103) 148/58 (04/03 0535) SpO2:  [94 %-100 %] 98 % (04/03 0535) Last BM Date: 02/08/17  Intake/Output from previous day: 04/02 0701 - 04/03 0700 In: 3025 [P.O.:300; I.V.:2725] Out: 1525 [Urine:1500; Blood:25] Intake/Output this shift: No intake/output data recorded.  Lungs: CTAb  Cardiovascular: RRR  Abd: soft, NT, ND  Extremities: no edema  Neuro: AOx4  Lab Results: CBC   Recent Labs  02/09/17 1357 02/10/17 0442  WBC 10.3 10.6*  HGB 15.3 15.3  HCT 46.5 45.4  PLT 231 252   BMET  Recent Labs  02/09/17 1357 02/10/17 0442  NA  --  140  K  --  4.1  CL  --  106  CO2  --  28  GLUCOSE  --  126*  BUN  --  12  CREATININE 1.03 0.86  CALCIUM  --  8.9   PT/INR No results for input(s): LABPROT, INR in the last 72 hours. ABG No results for input(s): PHART, HCO3 in the last 72 hours.  Invalid input(s): PCO2, PO2  Studies/Results:  Anti-infectives: Anti-infectives    Start     Dose/Rate Route Frequency Ordered Stop   02/09/17 0800  cefoTEtan in Dextrose 5% (CEFOTAN) IVPB 2 g     2 g Intravenous On call to O.R. 02/09/17 0717 02/09/17 1000      Medications: Scheduled Meds: . enoxaparin (LOVENOX) injection  30 mg Subcutaneous Q12H  . pantoprazole (PROTONIX) IV  40 mg Intravenous QHS  . [START ON 02/11/2017] protein supplement shake  2 oz Oral Q2H   Continuous Infusions: . sodium chloride 1,000 mL (02/10/17 0143)  . lactated ringers 75 mL/hr at 02/09/17 0845   PRN Meds:.oxyCODONE **AND** acetaminophen, acetaminophen, morphine injection, ondansetron (ZOFRAN) IV  Assessment/Plan: Patient Active Problem List   Diagnosis Date Noted  . Obesity  02/09/2017  . Screening for colon cancer   . Preoperative clearance 01/21/2017  . Obstructive sleep apnea 08/13/2009  . DYSPNEA 08/13/2009  . ALLERGY 08/13/2009   s/p Procedure(s): LAPAROSCOPIC GASTRIC SLEEVE RESECTION, UPPER ENDO UPPER GI ENDOSCOPY 02/09/2017 -continue ERAS protocol, potential discharge today  Disposition:  LOS: 1 day  The patient will be in the hospital for normal postop protocol  Rodman Pickle, MD [phone] [facility]

## 2017-02-10 NOTE — Progress Notes (Signed)
Patient alert and oriented, pain is controlled. Patient is tolerating fluids, advanced to protein shake today, patient is tolerating well.  Reviewed Gastric sleeve discharge instructions with patient and patient is able to articulate understanding.  Provided information on BELT program, Support Group and [facility] outpatient pharmacy. All questions answered, will continue to monitor.  Phyllicia Dudek RN  

## 2017-02-10 NOTE — Progress Notes (Signed)
Discharge instructions gone over with pt with all questions answered. Pt has medications already filled at home. Wife at bedside during discharge.

## 2017-02-10 NOTE — Discharge Instructions (Signed)

## 2017-02-10 NOTE — Discharge Summary (Signed)
Physician Discharge Summary  SADAO WEYER ZOX:[id number] DOB: 03-Jan-1966 DOA: 02/09/2017  PCP: Lolita Patella, MD  Admit date: 02/09/2017 Discharge date: 02/10/2017  Recommendations for Outpatient Follow-up:  1.  (include homehealth, outpatient follow-up instructions, specific recommendations for PCP to follow-up on, etc.)  Follow-up Information    Rodman Pickle, MD Follow up on 03/06/2017.   Specialty:  General Surgery Why:  post op follow up appointment at 1030am  Contact information: [address] [place] [address] [phone]        Rodman Pickle, MD Follow up.   Specialty:  General Surgery Contact information: [address] [place] [address] [phone]          Discharge Diagnoses:  Active Problems:   Obesity   Surgical Procedure: Laparoscopic Sleeve Gastrectomy, upper endoscopy  Discharge Condition: Good Disposition: Home  Diet recommendation: Postoperative sleeve gastrectomy diet (liquids only)  Filed Weights   02/09/17 0732  Weight: (!) 171.4 kg (377 lb 12.8 oz)     Hospital Course:  The patient was admitted after undergoing laparoscopic sleeve gastrectomy. POD 0 he ambulated well. POD 1 he was started on the water diet protocol and tolerated 400 ml in the first shift. Once meeting the water amount he was advanced to bariatric protein shakes which they tolerated and were discharged home POD 1.  Treatments: surgery: laparoscopic sleeve gastrectomy  Discharge Instructions  Discharge Instructions    AMB Referral to [facility] Care Management    Complete by:  As directed    Please assign UMR member for post discharge call. Currently at [facility]. Denies having any Link to Wellness needs currently. Please call with questions. Thanks. Raiford Noble, MSN-Ed, RN,BSN-THN Care [facility] Liaison-[phone]   Reason for consult:  Please assign UMR member for post discharge call   Expected date of contact:   1-3 days (reserved for hospital discharges)   Ambulate hourly while awake    Complete by:  As directed    Call MD for:  difficulty breathing, headache or visual disturbances    Complete by:  As directed    Call MD for:  persistant dizziness or light-headedness    Complete by:  As directed    Call MD for:  persistant nausea and vomiting    Complete by:  As directed    Call MD for:  redness, tenderness, or signs of infection (pain, swelling, redness, odor or green/yellow discharge around incision site)    Complete by:  As directed    Call MD for:  severe uncontrolled pain    Complete by:  As directed    Call MD for:  temperature >101 F    Complete by:  As directed    Diet bariatric full liquid    Complete by:  As directed    Discharge wound care:    Complete by:  As directed    Remove Bandaids tomorrow, ok to shower tomorrow. Steristrips may fall off in 1-3 weeks.   Incentive spirometry    Complete by:  As directed    Perform hourly while awake     Allergies as of 02/10/2017   No Known Allergies     Medication List    STOP taking these medications   ibuprofen 200 MG tablet Commonly known as:  ADVIL,MOTRIN     TAKE these medications   cetirizine 10 MG tablet Commonly known as:  ZYRTEC Take 10 mg by mouth daily.   doxylamine (Sleep) 25 MG  tablet Commonly known as:  UNISOM Take 25 mg by mouth at bedtime as needed for sleep.   famotidine 10 MG tablet Commonly known as:  PEPCID Take 10 mg by mouth daily as needed for heartburn or indigestion.   mometasone 50 MCG/ACT nasal spray Commonly known as:  NASONEX Place 1-2 sprays into the nose daily.   multivitamin with minerals Tabs tablet Take 1 tablet by mouth 2 (two) times daily.   pantoprazole 40 MG tablet Commonly known as:  PROTONIX Take 40 mg by mouth daily.   VISINE OP Apply 1 drop to eye daily as needed (dry eyes).      Follow-up Information    Rodman Pickle, MD Follow up on 03/06/2017.   Specialty:   General Surgery Why:  post op follow up appointment at 1030am  Contact information: [address] [place] [address] [phone]        Rodman Pickle, MD Follow up.   Specialty:  General Surgery Contact information: [address] [place] [address] [place] [address] [phone]            The results of significant diagnostics from this hospitalization (including imaging, microbiology, ancillary and laboratory) are listed below for reference.    Significant Diagnostic Studies: Dg Chest 2 View  Result Date: 01/21/2017 CLINICAL DATA:  Pneumonia EXAM: CHEST  2 VIEW COMPARISON:  12/22/2016 FINDINGS: Heart and mediastinal contours are within normal limits. No focal opacities or effusions. No acute bony abnormality. No focal opacities noted in the left upper lobe as questioned previously. IMPRESSION: No active cardiopulmonary disease. Electronically Signed   By: Charlett Nose M.D.   On: 01/21/2017 13:11    Labs: Basic Metabolic Panel:  Recent Labs Lab 02/09/17 1357 02/10/17 0442  NA  --  140  K  --  4.1  CL  --  106  CO2  --  28  GLUCOSE  --  126*  BUN  --  12  CREATININE 1.03 0.86  CALCIUM  --  8.9   Liver Function Tests:  Recent Labs Lab 02/10/17 0442  AST 35  ALT 35  ALKPHOS 43  BILITOT 0.7  PROT 6.6  ALBUMIN 3.7    CBC:  Recent Labs Lab 02/09/17 1203 02/09/17 1357 02/10/17 0442  WBC  --  10.3 10.6*  NEUTROABS  --   --  8.6*  HGB 14.9 15.3 15.3  HCT 45.2 46.5 45.4  MCV  --  89.6 89.2  PLT  --  231 252    CBG: No results for input(s): GLUCAP in the last 168 hours.  Active Problems:   Obesity   Time coordinating discharge: <14min

## 2017-02-10 NOTE — Consult Note (Signed)
   [facility] CM Inpatient Consult   02/10/2017  Jorge Strong 05-Apr-1966 [id number]    Came to visit [name] on behalf of [facility] Care Management/ Link to Wellness program for [employer] employees/dependents with [facility] insurance. He denies having any Link to Wellness needs but appreciative of the information. Provided Link to [employer] and contact information. Confirmed best contact number as [phone] for post discharge follow up call.    Raiford Noble, MSN-Ed, RN,BSN [facility] Liaison [phone]

## 2017-02-11 ENCOUNTER — Encounter: Payer: Self-pay | Admitting: *Deleted

## 2017-02-11 ENCOUNTER — Other Ambulatory Visit: Payer: Self-pay | Admitting: *Deleted

## 2017-02-11 NOTE — Patient Outreach (Signed)
[facility]) Care Management  02/11/2017  Jorge Strong Oct 22, 1966 [id number]   Subjective:  Telephone call to patient's home / mobile number, no answer, left HIPAA compliant voicemail message, and requested call back. Telephone call from patient and HIPAA verified.   Discussed [facility] Care Management UMR Transition of care follow up, patient voiced understanding, and is in agreement to complete follow up. Patient states he is doing well, tolerating liquid diet, ambulating outside, has follow up appointment with surgeon scheduled on 03/06/17, and has a follow up appointment with nutritionist next week.  States he also received a transition of care call from primary MD's office this morning and he very appreciative of the follow up calls.  Patient states he does not have any transition of care, care coordination, disease management, disease monitoring, transportation, community resource, or pharmacy needs at this time.   States this very appreciative of the follow that he has also received from [facility] Care Management team and is in agreement to receive [facility] Care Management information.   Objective: Per chart review, patient hospitalized 02/09/17 -02/10/17 for obesity.   Status post laparoscopic Sleeve Gastrectomy on 02/09/17.  Patient also has a history of obstructive sleep apnea (OSA).     Assessment: Received UMR Transition of care referral on 02/04/17 via [employer] report.   Transition of care follow up completed, no care management needs, and will proceed with case closure.   Plan: RNCM will send patient successful outreach letter, [facility] pamphlet, and magnet. RNCM will send case closure due to follow up completed / no care management needs request to Iverson Alamin at [facility] Care Management.   Maikel Neisler H. Gardiner Barefoot, BSN, CCM [facility] Care Management [facility] Telephonic CM Phone: [phone] Fax: [phone]

## 2017-02-19 ENCOUNTER — Telehealth (HOSPITAL_COMMUNITY): Payer: Self-pay

## 2017-02-19 NOTE — Telephone Encounter (Signed)
Made discharge phone call to patient . Asking the following questions.    1. Do you have someone to care for you now that you are home?  independent 2. Are you having pain now that is not relieved by your pain medication?  Doesn't need pain medication 3. Are you able to drink the recommended daily amount of fluids (48 ounces minimum/day) and protein (60-80 grams/day) as prescribed by the dietitian or nutritional counselor?  80 grams of protein and 64 ounces of water, using crystal light to help get in fluids 4. Are you taking the vitamins and minerals as prescribed?  No problems 5. Do you have the "on call" number to contact your surgeon if you have a problem or question?  yes 6. Are your incisions free of redness, swelling or drainage? (If steri strips, address that these can fall off, shower as tolerated) they look good 7. Have your bowels moved since your surgery?  If not, are you passing gas?  Yes some constipation using miralax 8. Are you up and walking 3-4 times per day?  Yes tires easier but walking daily 9. Were you provided your discharge medications before your surgery or before you were discharged from the hospital and are you taking them without problem?  No problems with medications

## 2017-02-24 ENCOUNTER — Encounter: Payer: 59 | Attending: General Surgery | Admitting: Skilled Nursing Facility1

## 2017-02-24 DIAGNOSIS — Z713 Dietary counseling and surveillance: Secondary | ICD-10-CM | POA: Diagnosis not present

## 2017-02-24 DIAGNOSIS — Z6841 Body Mass Index (BMI) 40.0 and over, adult: Secondary | ICD-10-CM

## 2017-02-25 ENCOUNTER — Encounter: Payer: Self-pay | Admitting: Pulmonary Disease

## 2017-02-25 ENCOUNTER — Encounter: Payer: Self-pay | Admitting: Skilled Nursing Facility1

## 2017-02-25 NOTE — Progress Notes (Signed)
Bariatric Class:  Appt start time: 1530 end time:  1630.  2 Week Post-Operative Nutrition Class  Patient was seen on 02/24/2017 for [facility]ative [facility]  Struggling with constipation.  Surgery date: Sleeve Gastrectomy  Surgery type: 02/09/2017 Start weight at [facility]: 392 Weight today: 361 Weight change: 31  TANITA  BODY COMP RESULTS  02/24/2017   BMI (kg/m^2) 42.81   Fat Mass (lbs) 167.6   Fat Free Mass (lbs) 193.4   Total Body Water (lbs) 154   The following the learning objectives were met by the patient during this course:  Identifies Phase 3A (Soft, High Proteins) Dietary Goals and will begin from 2 weeks post-operatively to 2 months post-operatively  Identifies appropriate sources of fluids and proteins   States protein recommendations and appropriate sources post-operatively  Identifies the need for appropriate texture modifications, mastication, and bite sizes when consuming solids  Identifies appropriate multivitamin and calcium sources post-operatively  Describes the need for physical activity post-operatively and will follow MD recommendations  States when to call healthcare provider regarding medication questions or post-operative complications  Handouts given during class include:  Phase 3A: Soft, High Protein Diet Handout  Follow-Up Plan: Patient will follow-up at [facility] in 6 weeks for 2 month post-op nutrition visit for diet advancement per MD.

## 2017-02-26 DIAGNOSIS — G4733 Obstructive sleep apnea (adult) (pediatric): Secondary | ICD-10-CM | POA: Diagnosis not present

## 2017-03-19 ENCOUNTER — Ambulatory Visit: Payer: Self-pay | Admitting: Acute Care

## 2017-03-27 ENCOUNTER — Telehealth: Payer: Self-pay | Admitting: Pulmonary Disease

## 2017-03-27 NOTE — Telephone Encounter (Signed)
   CPAP download on 14 cm water: AHI 7.2, 33%.  [name], can you  tell patient that on lower pressure his AHI is higher. We need to try him on 16 cm water again if he is okay with that. Thanks. He needs to use his cpap longer too.   Pollie MeyerJ. Angelo A de Dios, MD 03/27/2017, 2:00 PM [facility] Pager [phone] After 3 pm or if no answer, call [phone]

## 2017-03-27 NOTE — Telephone Encounter (Signed)
lmomtcb x1 

## 2017-04-01 DIAGNOSIS — Z9884 Bariatric surgery status: Secondary | ICD-10-CM | POA: Diagnosis not present

## 2017-04-01 DIAGNOSIS — K912 Postsurgical malabsorption, not elsewhere classified: Secondary | ICD-10-CM | POA: Diagnosis not present

## 2017-04-01 DIAGNOSIS — R69 Illness, unspecified: Secondary | ICD-10-CM | POA: Diagnosis not present

## 2017-04-01 DIAGNOSIS — K219 Gastro-esophageal reflux disease without esophagitis: Secondary | ICD-10-CM | POA: Diagnosis not present

## 2017-04-01 DIAGNOSIS — F411 Generalized anxiety disorder: Secondary | ICD-10-CM | POA: Diagnosis not present

## 2017-04-01 DIAGNOSIS — E669 Obesity, unspecified: Secondary | ICD-10-CM | POA: Diagnosis not present

## 2017-04-01 NOTE — Telephone Encounter (Signed)
lmomtcb x 2  

## 2017-04-03 NOTE — Telephone Encounter (Signed)
lmomtcb x 3  

## 2017-04-08 ENCOUNTER — Encounter: Payer: Self-pay | Admitting: Registered"

## 2017-04-08 ENCOUNTER — Encounter: Payer: 59 | Attending: General Surgery | Admitting: Registered"

## 2017-04-08 ENCOUNTER — Ambulatory Visit: Payer: Self-pay | Admitting: Skilled Nursing Facility1

## 2017-04-08 DIAGNOSIS — Z713 Dietary counseling and surveillance: Secondary | ICD-10-CM | POA: Insufficient documentation

## 2017-04-08 DIAGNOSIS — E669 Obesity, unspecified: Secondary | ICD-10-CM

## 2017-04-08 NOTE — Telephone Encounter (Signed)
Unable to reach letter was done and placed in outgoing mail by CW. Nothing further is needed

## 2017-04-08 NOTE — Progress Notes (Signed)
Follow-up visit:  8 Weeks Post-Operative Sleeve Gastrectomy Surgery  Medical Nutrition Therapy:  Appt start time: 2:35 end time:  3:10.  Primary concerns today: Post-operative Bariatric Surgery Nutrition Management. Pt states he manages constipation with miralax and stool softener. Pt reports that water tastes different now and doesn't like it as much as used to prior to surgery. Pt states he has already tried onions and able to tolerate them well.  Pt states that he weighs himself on weekends only.   Non scale victories: feels   Surgery date: 02/09/2017 Surgery type: sleeve gastrectomy [facility] Weight today: 329.6 lbs Weight change: 31.4 lbs loss from previous visit (02/24/2017) Total weight lost: 55.7 lbs Weight loss goal: none stated   TANITA  BODY COMP RESULTS  02/24/2017 04/08/2017   BMI (kg/m^2) 42.81 40.1   Fat Mass (lbs) 167.6 128.6   Fat Free Mass (lbs) 193.4 201.0   Total Body Water (lbs) 154 146.6    Preferred Learning Style:   No preference indicated   Learning Readiness:   Ready  Change in progress  24-hr recall: B (AM): 3 slices of bacon (9g), 1 egg (6g) Snk (AM): greek yogurt (10g) with nuts (5g)  L (PM): Premier protein (30g) Snk (PM): 4 oz chicken (28g) or chili from [employer]  D (PM): chicken (21g) Snk (PM): none  Fluid intake: 8 oz expresso, 64+ oz daily Estimated total protein intake: 80+ g daily  Medications: See list Supplementation: 2 multi and 3 Tums daily  Using straws: yes Drinking while eating: no Having you been chewing well: yes Chewing/swallowing difficulties: no Changes in vision: no Changes to mood/headaches: no Hair loss/Cahnges to skin/Changes to nails: no Any difficulty focusing or concentrating: no Sweating: no Dizziness/Lightheaded: no Palpitations: no  Carbonated beverages: no N/V/D/C/GAS: consitpation: yes,  Abdominal Pain: no Dumping syndrome: no   Recent physical activity:  Walking 1-1.315mi  2x/wk, hiking on weekends 2-743mi  Progress Towards Goal(s):  In progress.  Handouts given during visit include:  Phase IIIB High Protein + Vegetables   Nutritional Diagnosis:  NI-5.8.5 Inadeqate fiber intake As related to Phase IIIA: high protein diet.  As evidenced by constipation.    Intervention:  Nutrition counseling and education.  Teaching Method Utilized:  Visual Auditory  Barriers to learning/adherence to lifestyle change: none  Demonstrated degree of understanding via:  Teach Back   Monitoring/Evaluation:  Dietary intake, exercise, lap band fills, and body weight. Follow up in 3 months for 5 month post-op visit.

## 2017-04-10 NOTE — Addendum Note (Signed)
Addendum  created 04/10/17 1307 by Qaadir Kent, MD   Sign clinical note    

## 2017-04-13 ENCOUNTER — Telehealth: Payer: Self-pay | Admitting: Pulmonary Disease

## 2017-04-13 NOTE — Telephone Encounter (Signed)
AD  FYI  Spoke with pt today due to us sending him a letter to discuss his cpap download. Pt states he returned his cpap machine after he got his bariatric surgery. He states he did not feel like it was working well for him. He did state he has lost weight and he feels like he is doing better.

## 2017-04-13 NOTE — Telephone Encounter (Signed)
Noted.  Anything else I need to do ? thanks  J. Alexis FrockAngelo A de Dios, MD 04/13/2017, 9:38 PM [place] [facility] Pager [phone] After 3 pm or if no answer, call [phone]

## 2017-04-14 NOTE — Telephone Encounter (Signed)
Nothing further is needed. I will close the message.

## 2017-04-17 ENCOUNTER — Telehealth: Payer: Self-pay | Admitting: Registered"

## 2017-04-17 NOTE — Telephone Encounter (Signed)
Pt called inquiring about TANITA scale results because he misplaced his printout from the previous visit. RD shared TANITA results with pt and updated pt on how his body is decreasing in fat mass (lbs).

## 2017-04-29 DIAGNOSIS — F411 Generalized anxiety disorder: Secondary | ICD-10-CM | POA: Diagnosis not present

## 2017-04-30 MED FILL — MOMETASONE FUROATE 50 MCG S: 50 | 60 days supply | Qty: 34 | Fill #3

## 2017-05-06 DIAGNOSIS — Z13220 Encounter for screening for lipoid disorders: Secondary | ICD-10-CM | POA: Diagnosis not present

## 2017-05-06 DIAGNOSIS — Z125 Encounter for screening for malignant neoplasm of prostate: Secondary | ICD-10-CM | POA: Diagnosis not present

## 2017-05-06 DIAGNOSIS — Z0000 Encounter for general adult medical examination without abnormal findings: Secondary | ICD-10-CM | POA: Diagnosis not present

## 2017-05-06 DIAGNOSIS — Z6837 Body mass index (BMI) 37.0-37.9, adult: Secondary | ICD-10-CM | POA: Diagnosis not present

## 2017-05-06 DIAGNOSIS — N529 Male erectile dysfunction, unspecified: Secondary | ICD-10-CM | POA: Diagnosis not present

## 2017-05-06 DIAGNOSIS — L570 Actinic keratosis: Secondary | ICD-10-CM | POA: Diagnosis not present

## 2017-05-06 DIAGNOSIS — J309 Allergic rhinitis, unspecified: Secondary | ICD-10-CM | POA: Diagnosis not present

## 2017-06-05 DIAGNOSIS — F411 Generalized anxiety disorder: Secondary | ICD-10-CM | POA: Diagnosis not present

## 2017-06-23 MED FILL — MOMETASONE FUROATE 50 MCG S: 50 | 30 days supply | Qty: 17 | Fill #0

## 2017-07-06 ENCOUNTER — Ambulatory Visit: Payer: 59 | Admitting: Registered"

## 2017-07-23 MED FILL — MOMETASONE FUROATE 50 MCG S: 50 | 30 days supply | Qty: 17 | Fill #1

## 2017-08-16 MED FILL — MOMETASONE FUROATE 50 MCG S: 50 | 90 days supply | Qty: 51 | Fill #2

## 2017-08-17 IMAGING — DX DG UGI W/ KUB
1 series · 1 of 1 positions shown · non-contrast
Comparison: None.

CLINICAL DATA: Preoperative workup before gastric sleeve.

EXAM:
UPPER GI SERIES WITH KUB
TECHNIQUE: After obtaining a scout radiograph a routine upper GI series was
performed using thin barium
FLUOROSCOPY TIME:  Fluoroscopy Time:  1 minutes 27 seconds
Radiation Exposure Index (if provided by the fluoroscopic device):
72.94 mGy
Number of Acquired Spot Images: 4

[abdomen kub]
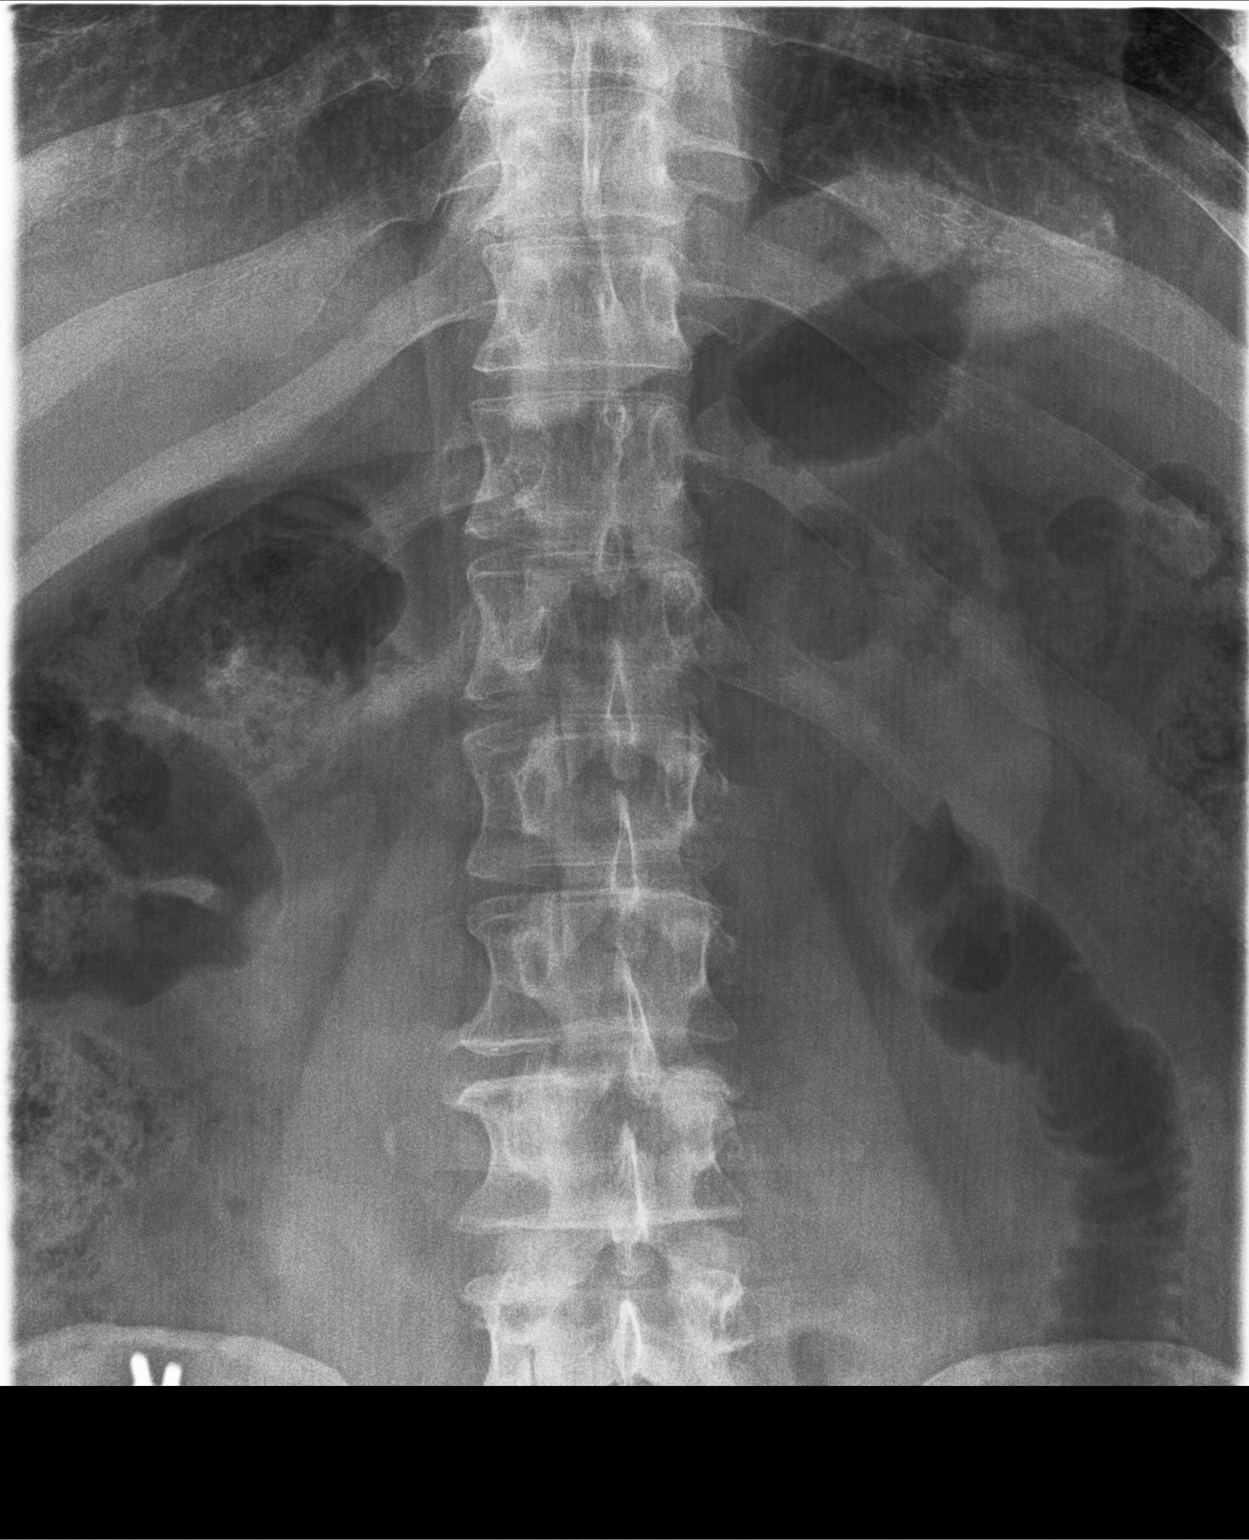

[1 of 1 positions shown; findings below may reference images not displayed]

FINDINGS: Oblique pharyngeal imaging is normal. No obstructive process or
aspiration.

The esophagus has normal distensibility and contour. Normal
motility. No reflux noted. A 13 mm barium tablet successfully
traversed the esophagus without delay.

Single contrast appearance of the stomach is normal. Shape and
distensibility is unremarkable and there is no evidence of fold
thickening or ulceration. No hiatal hernia is noted. Unremarkable
duodenal C-loop morphology and fold pattern.
IMPRESSION: Normal upper GI.

## 2017-08-17 IMAGING — CR DG CHEST 2V
2 series · 2 of 2 positions shown · non-contrast
Comparison: 10/30/2015.

CLINICAL DATA: Bariatric surgery screening.

EXAM:
CHEST  2 VIEW

[w chest pa]
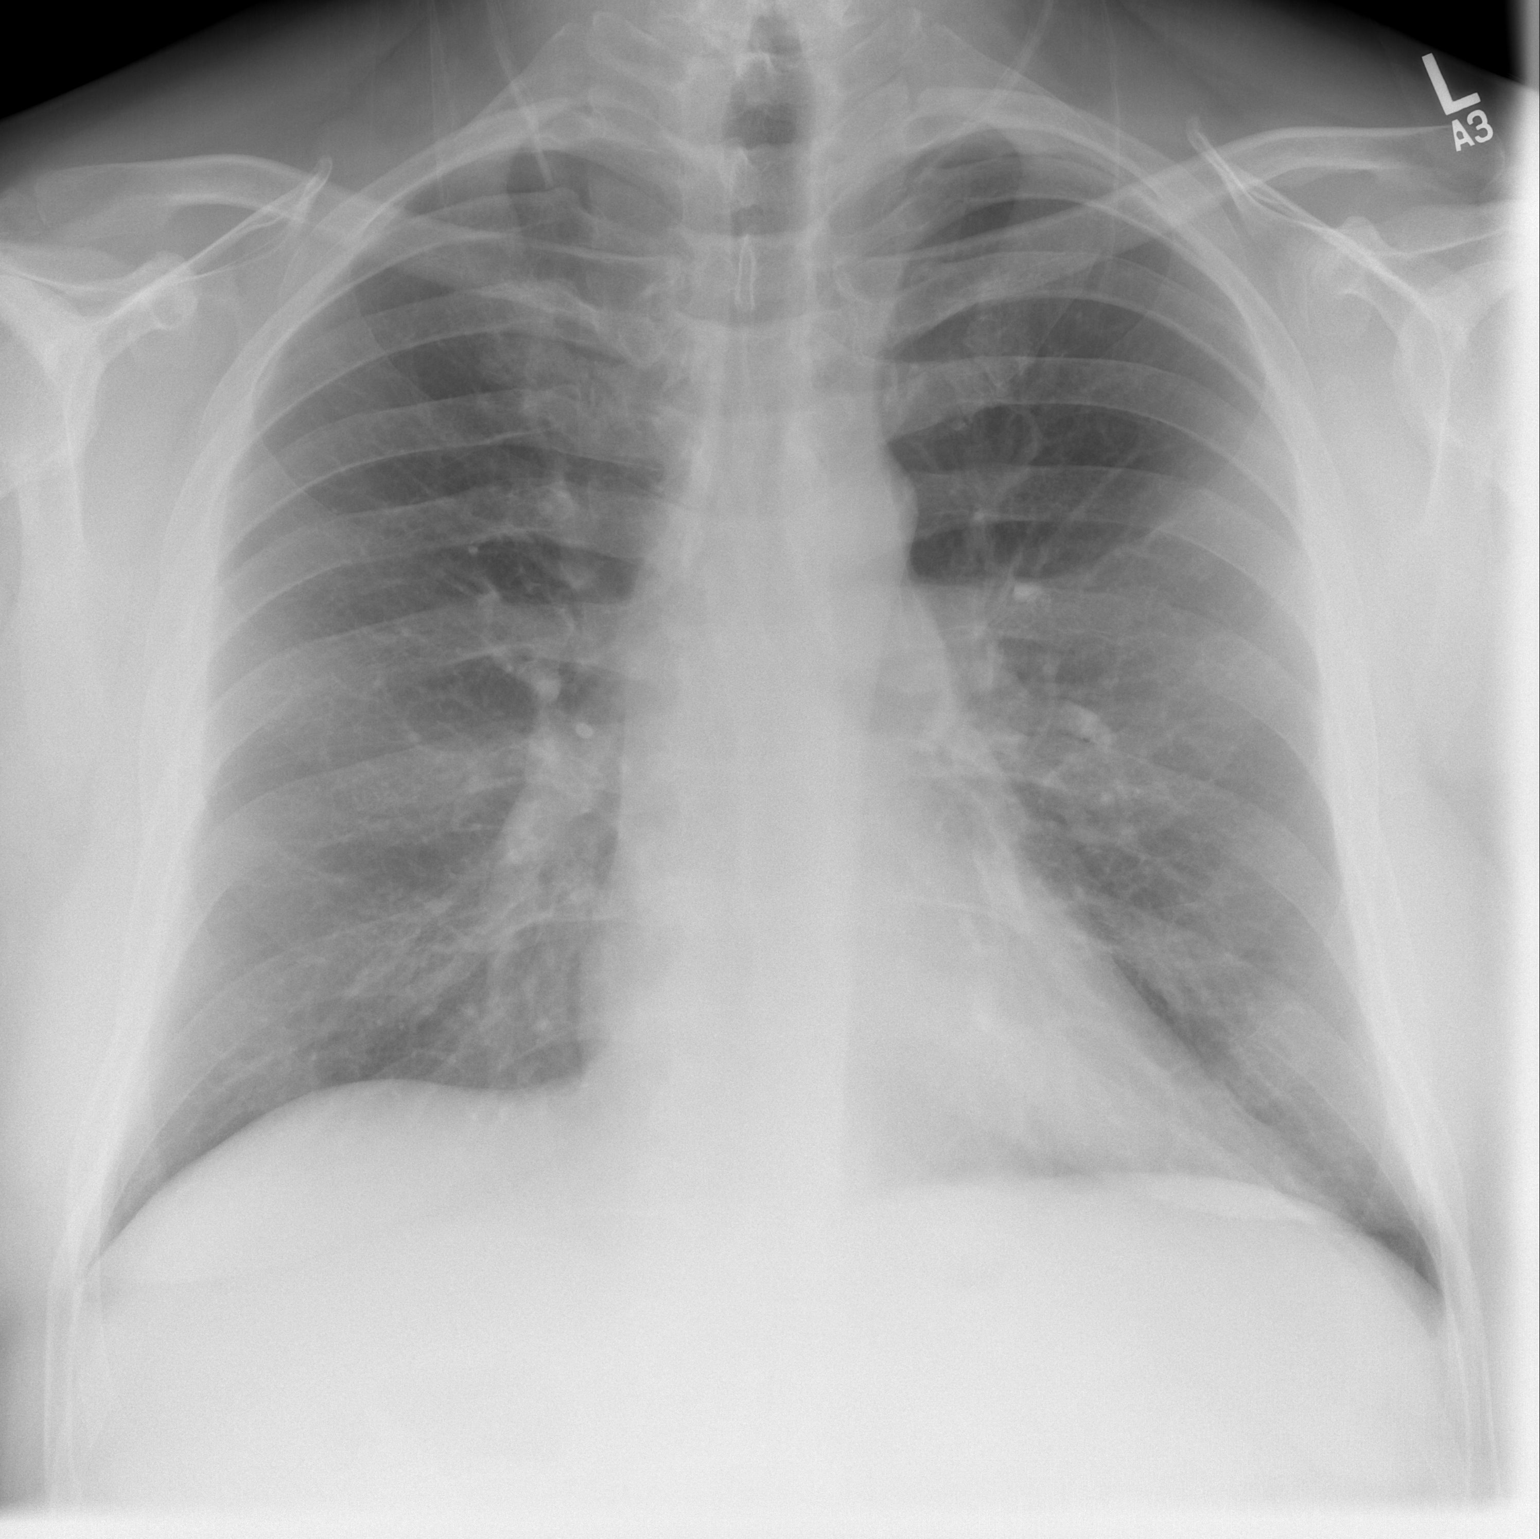

[w chest lat]
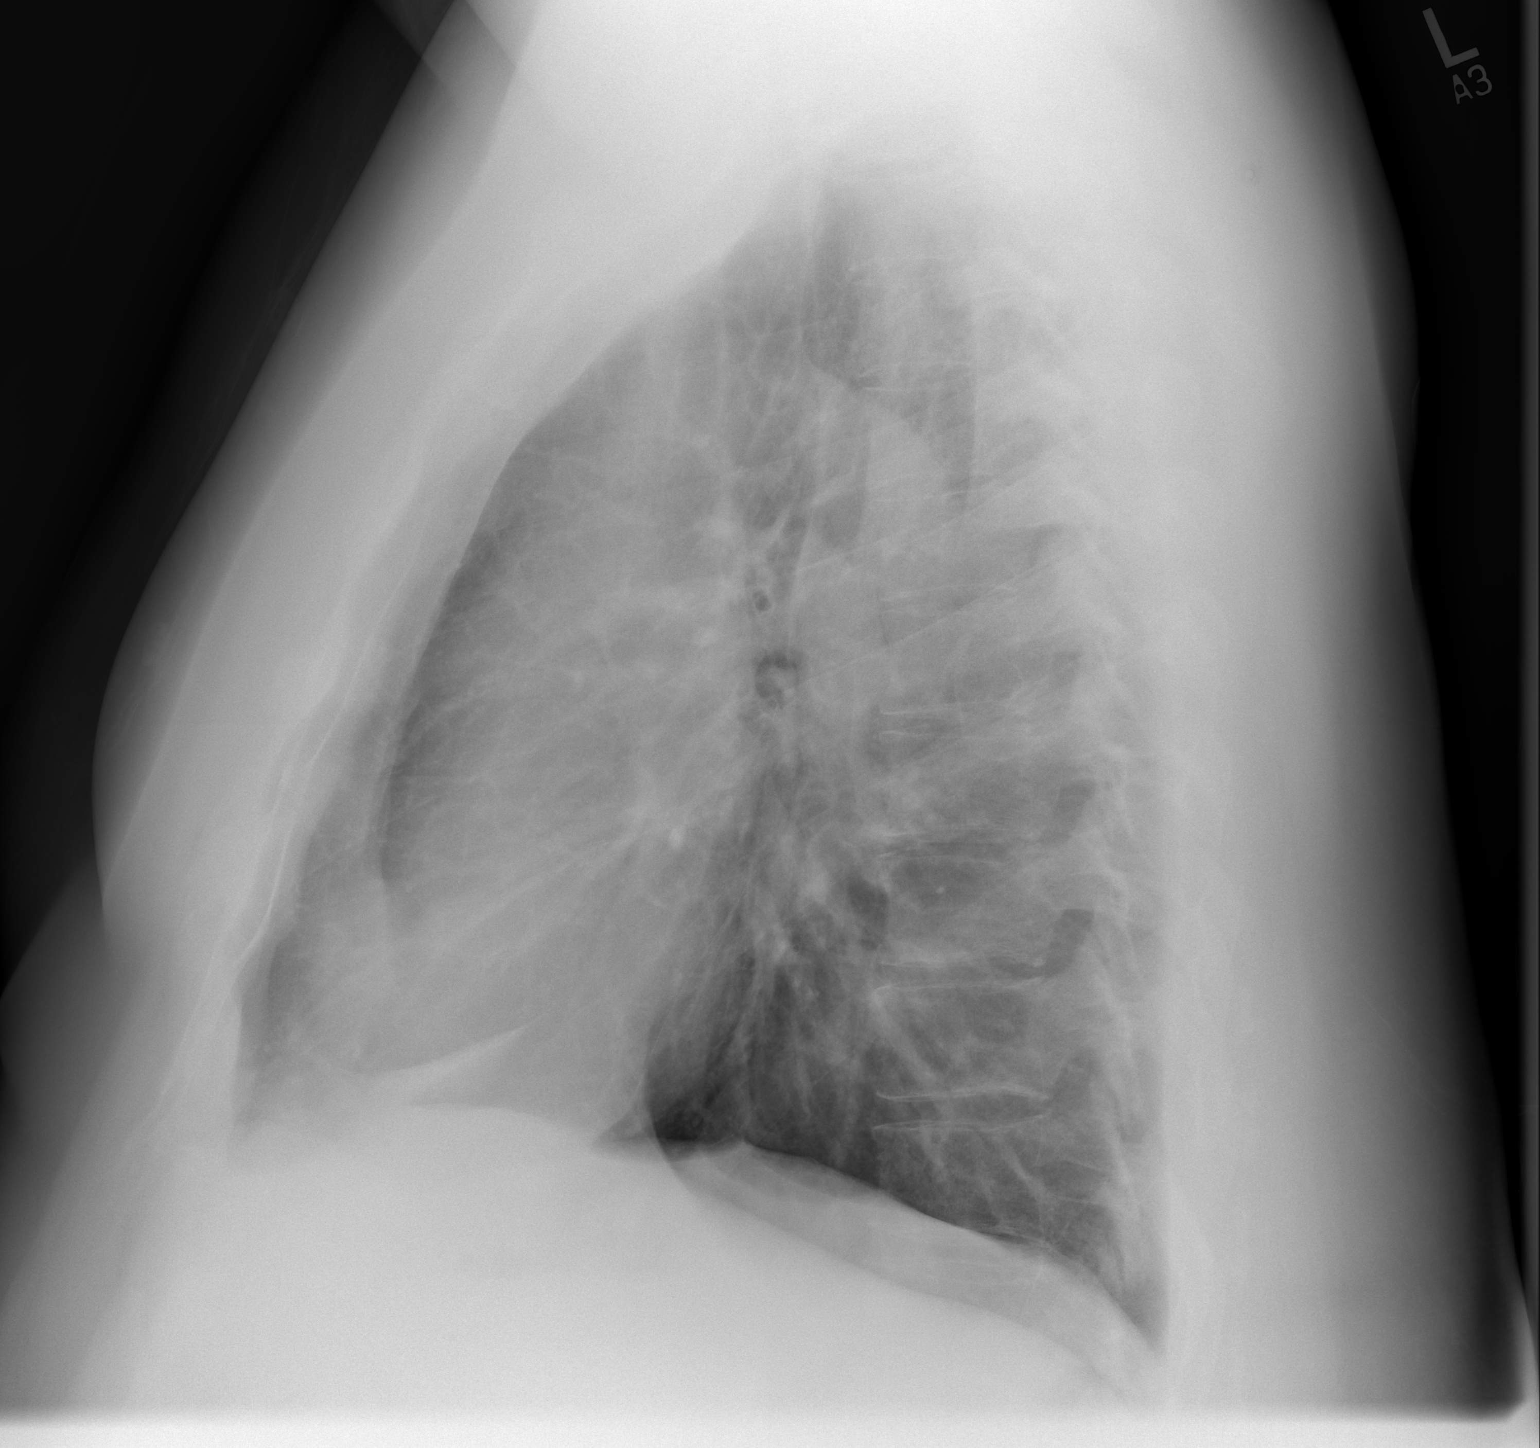

[2 of 2 positions shown; findings below may reference images not displayed]

FINDINGS: Mediastinum hilar structures normal. R size normal . Questionable
infiltrate in the left mid to upper lung. Findings may just be
related to overlying prominent soft tissues . No pleural effusion or
pneumothorax. No acute bony abnormality.
IMPRESSION: Questionable subtle infiltrate left mid to upper lung. Findings may
just be related overlying prominent soft tissues. Follow-up PA
lateral chest x-ray suggested . a

## 2017-09-16 IMAGING — DX DG CHEST 2V
3 series · 3 of 3 positions shown · non-contrast
Comparison: 12/22/2016

CLINICAL DATA: Pneumonia

EXAM:
CHEST  2 VIEW

[chest pa (1 of 2)]
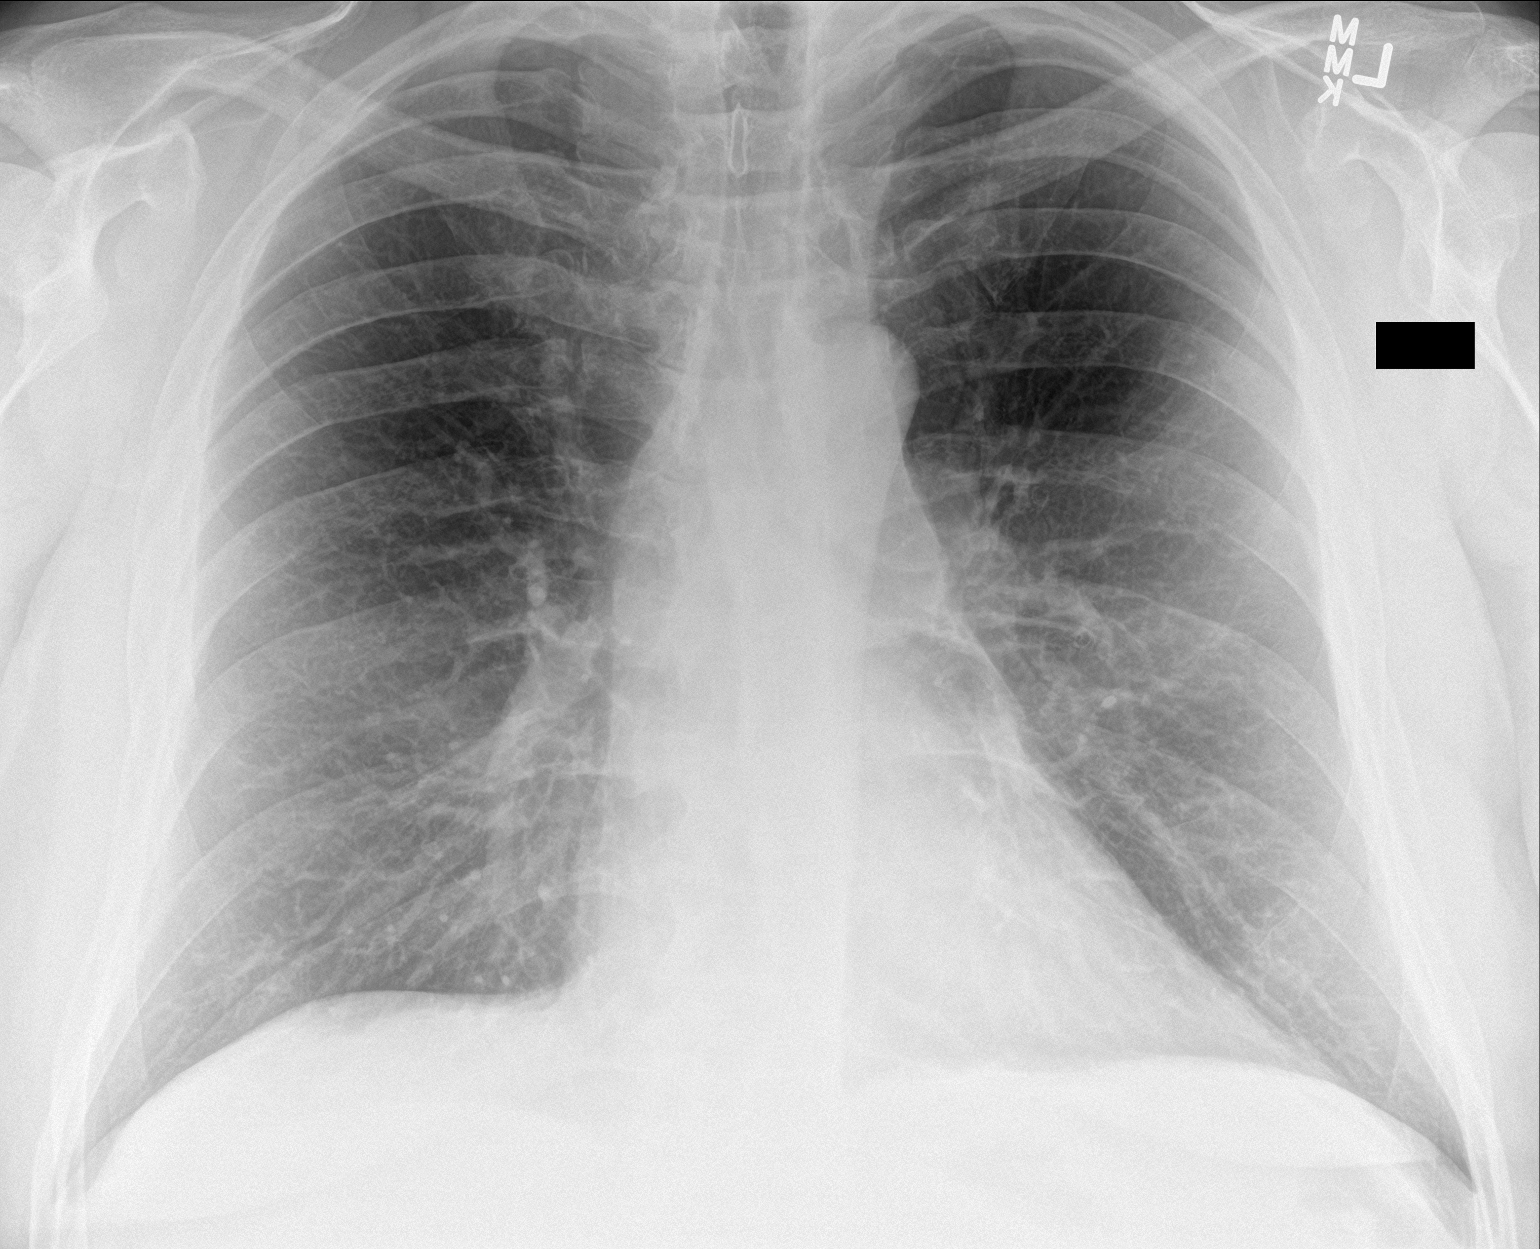

[chest lat]
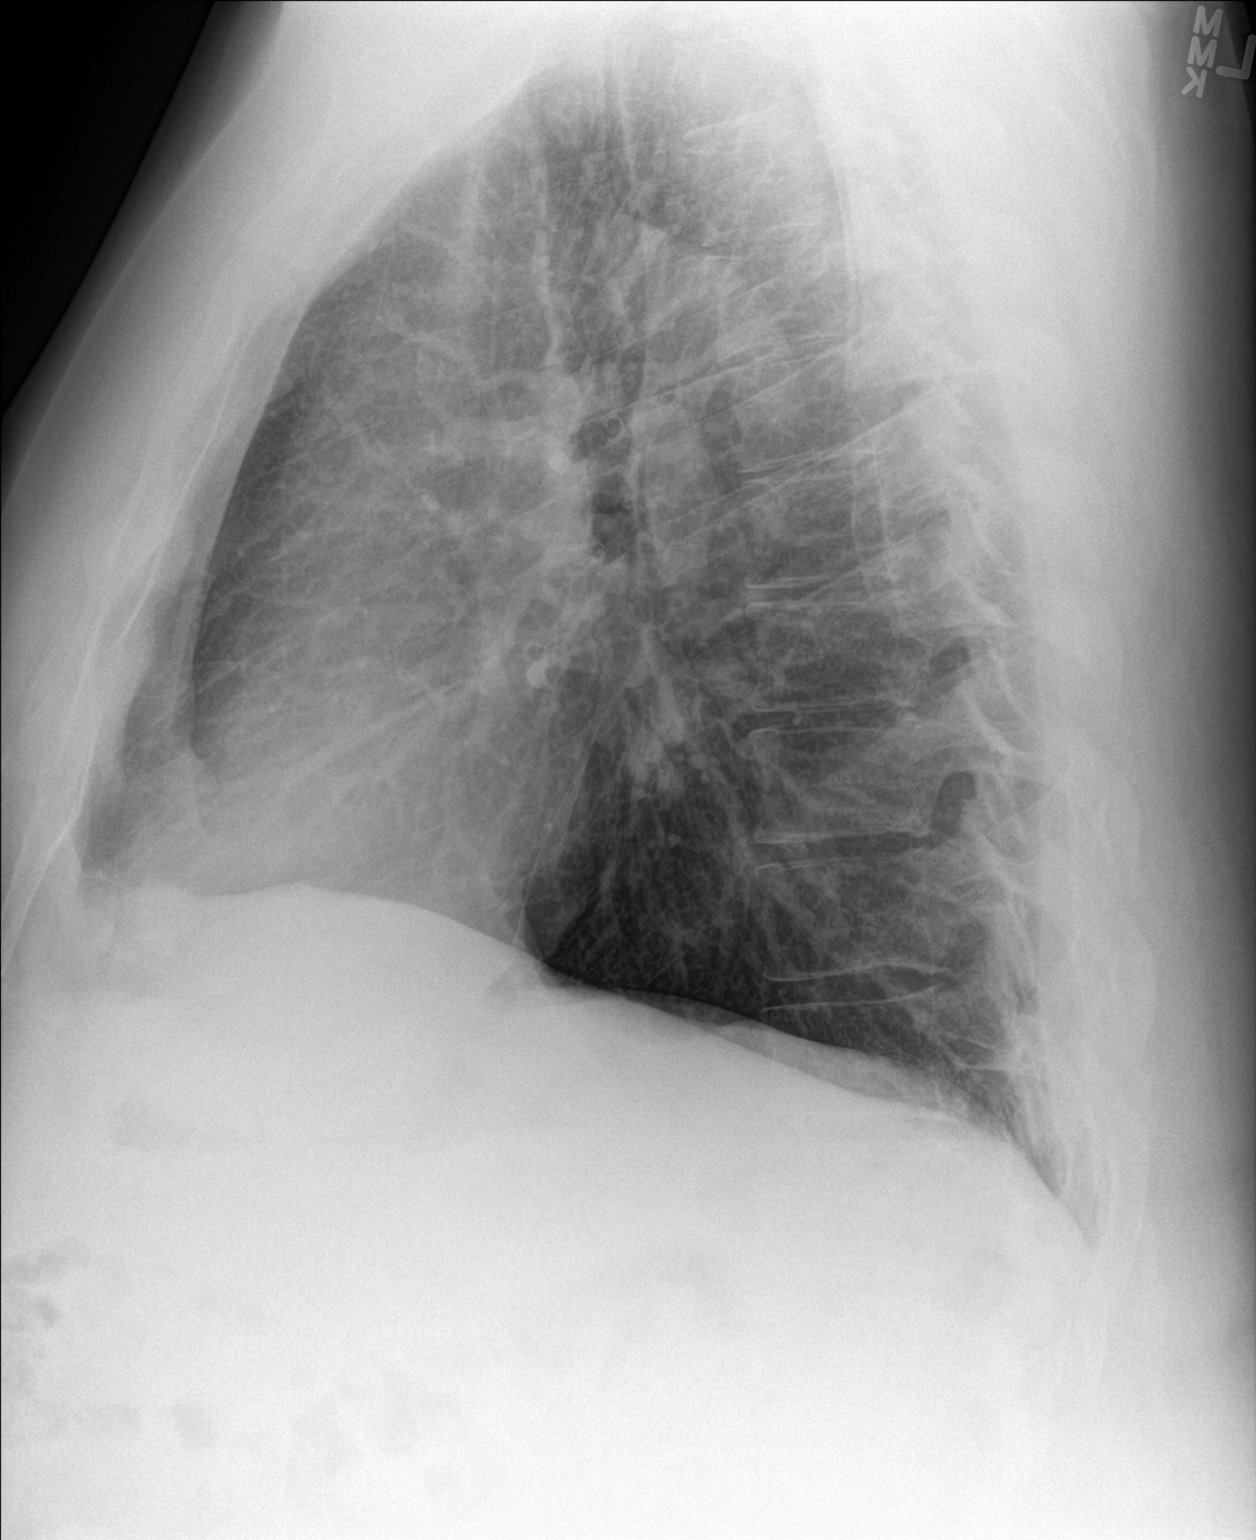

[chest pa (2 of 2)]
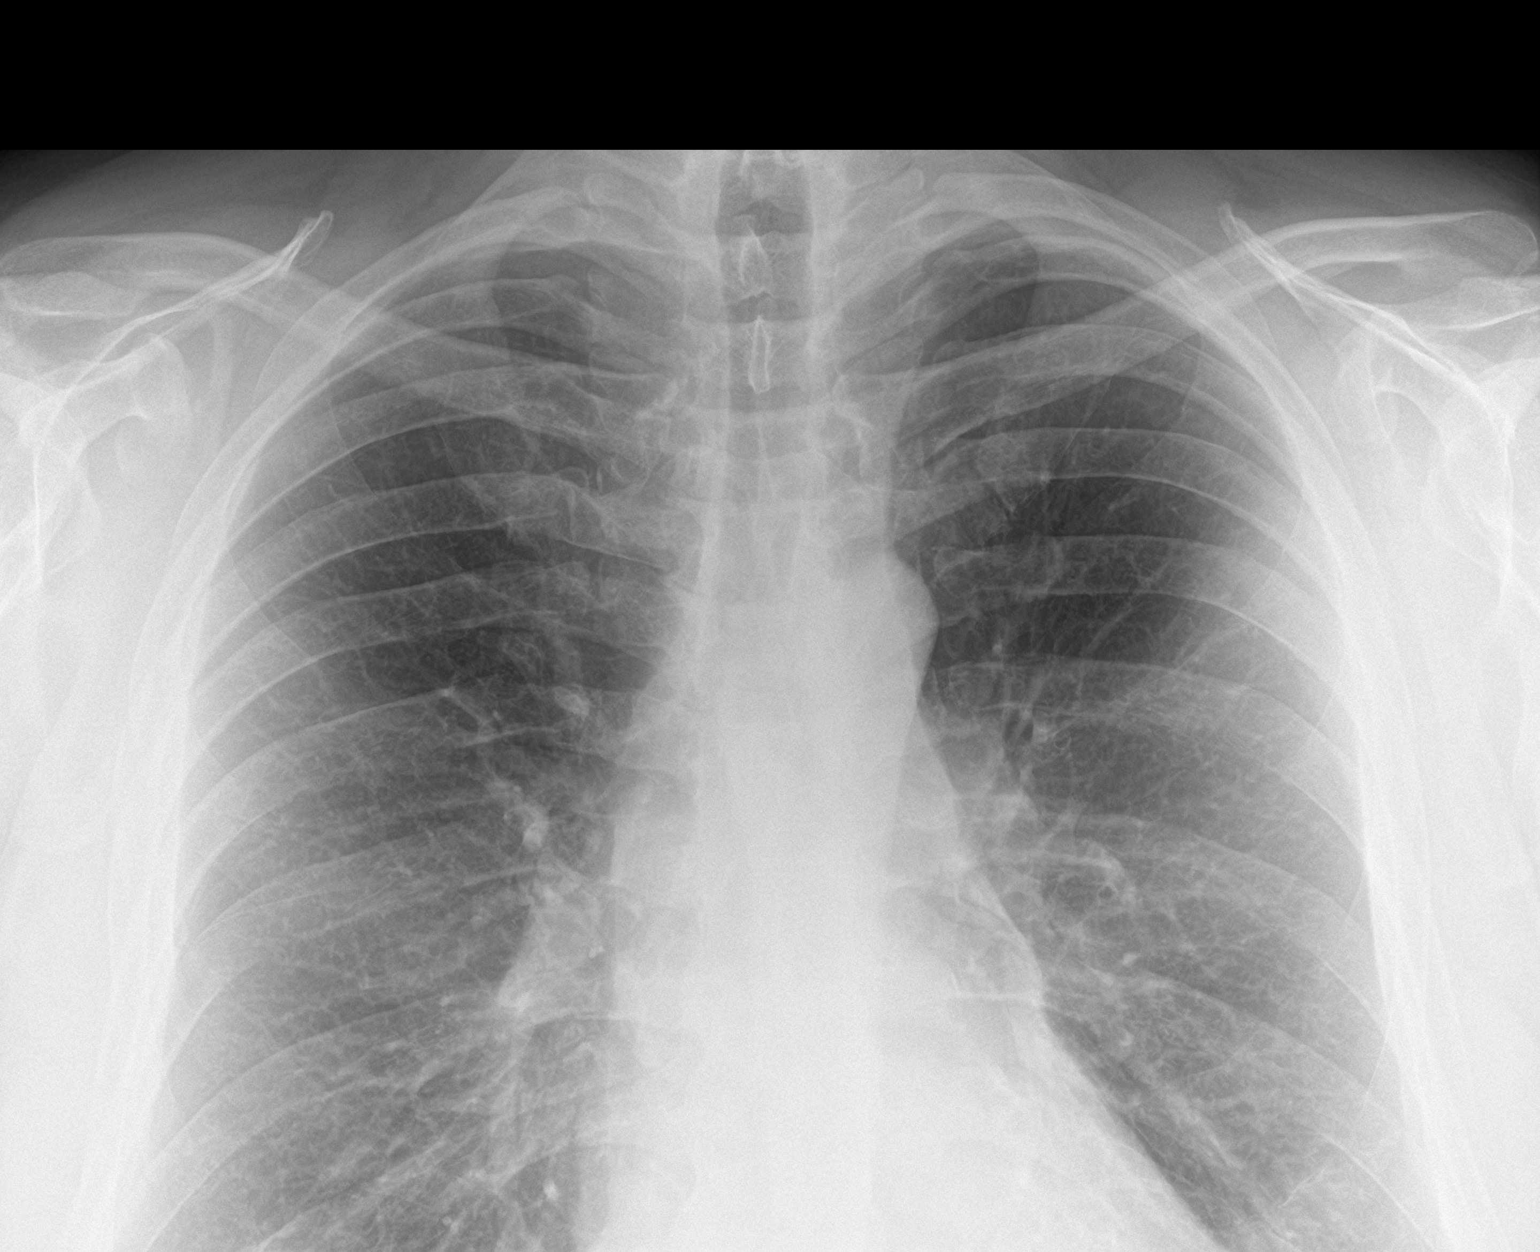

[3 of 3 positions shown; findings below may reference images not displayed]

FINDINGS: Heart and mediastinal contours are within normal limits. No focal
opacities or effusions. No acute bony abnormality. No focal
opacities noted in the left upper lobe as questioned previously.
IMPRESSION: No active cardiopulmonary disease.

## 2017-10-02 DIAGNOSIS — H52223 Regular astigmatism, bilateral: Secondary | ICD-10-CM | POA: Diagnosis not present

## 2017-10-02 DIAGNOSIS — H524 Presbyopia: Secondary | ICD-10-CM | POA: Diagnosis not present

## 2017-11-26 MED FILL — MOMETASONE FUROATE 50 MCG S: 50 | 30 days supply | Qty: 17 | Fill #3

## 2018-01-01 MED FILL — MOMETASONE FUROATE 50 MCG S: 50 | 90 days supply | Qty: 51 | Fill #4 | Status: TO

## 2018-02-24 DIAGNOSIS — Z9884 Bariatric surgery status: Secondary | ICD-10-CM | POA: Diagnosis not present

## 2018-02-24 DIAGNOSIS — R69 Illness, unspecified: Secondary | ICD-10-CM | POA: Diagnosis not present

## 2018-02-24 DIAGNOSIS — K912 Postsurgical malabsorption, not elsewhere classified: Secondary | ICD-10-CM | POA: Diagnosis not present

## 2018-04-22 MED FILL — MOMETASONE FUROATE 50 MCG S: 50 | 90 days supply | Qty: 51 | Fill #0

## 2018-05-17 DIAGNOSIS — Z13220 Encounter for screening for lipoid disorders: Secondary | ICD-10-CM | POA: Diagnosis not present

## 2018-05-17 DIAGNOSIS — Z6827 Body mass index (BMI) 27.0-27.9, adult: Secondary | ICD-10-CM | POA: Diagnosis not present

## 2018-05-17 DIAGNOSIS — J309 Allergic rhinitis, unspecified: Secondary | ICD-10-CM | POA: Diagnosis not present

## 2018-05-17 DIAGNOSIS — N401 Enlarged prostate with lower urinary tract symptoms: Secondary | ICD-10-CM | POA: Diagnosis not present

## 2018-05-17 DIAGNOSIS — N529 Male erectile dysfunction, unspecified: Secondary | ICD-10-CM | POA: Diagnosis not present

## 2018-05-17 DIAGNOSIS — Z0000 Encounter for general adult medical examination without abnormal findings: Secondary | ICD-10-CM | POA: Diagnosis not present

## 2018-05-17 MED FILL — TAMSULOSIN HCL 0.4 MG CAP: 0.4 | 30 days supply | Qty: 30 | Fill #0

## 2018-06-30 MED FILL — TAMSULOSIN HCL 0.4 MG CAP: 0.4 | 30 days supply | Qty: 30 | Fill #1

## 2018-07-30 MED FILL — MOMETASONE FUROATE 50 MCG S: 50 | 90 days supply | Qty: 51 | Fill #0

## 2018-09-06 MED FILL — TAMSULOSIN HCL 0.4 MG CAP: 0.4 | 30 days supply | Qty: 30 | Fill #2

## 2018-10-13 MED FILL — TAMSULOSIN HCL 0.4 MG CAP: 0.4 | 30 days supply | Qty: 30 | Fill #3

## 2018-10-27 MED FILL — MOMETASONE FUROATE 50 MCG S: 50 | 90 days supply | Qty: 51 | Fill #1

## 2018-11-18 MED FILL — TAMSULOSIN HCL 0.4 MG CAP: 0.4 | 30 days supply | Qty: 30 | Fill #4

## 2018-12-13 ENCOUNTER — Ambulatory Visit (INDEPENDENT_AMBULATORY_CARE_PROVIDER_SITE_OTHER): Payer: Self-pay | Admitting: Nurse Practitioner

## 2018-12-13 VITALS — BP 135/75 | HR 85 | Temp 99.2°F | Resp 18 | Wt 233.6 lb

## 2018-12-13 DIAGNOSIS — J101 Influenza due to other identified influenza virus with other respiratory manifestations: Secondary | ICD-10-CM

## 2018-12-13 DIAGNOSIS — R6889 Other general symptoms and signs: Secondary | ICD-10-CM

## 2018-12-13 LAB — POCT INFLUENZA A/B
INFLUENZA A, POC: POSITIVE — AB
Influenza B, POC: NEGATIVE

## 2018-12-13 MED ORDER — OSELTAMIVIR PHOSPHATE 75 MG PO CAPS: 75.0000 mg | ORAL_CAPSULE | Freq: Two times a day (BID) | ORAL | 0 refills | Status: AC

## 2018-12-13 MED ORDER — PSEUDOEPH-BROMPHEN-DM 30-2-10 MG/5ML PO SYRP: 5.0000 mL | ORAL_SOLUTION | Freq: Four times a day (QID) | ORAL | 0 refills | Status: AC | PRN

## 2018-12-13 MED FILL — OSELTAMIVIR PHOSPHATE 75 MG: 75 | 5 days supply | Qty: 10 | Fill #0

## 2018-12-13 MED FILL — BROMPHENIR-PSEUDOEPHED-DM S: 30-2-10 | 7 days supply | Qty: 150 | Fill #0

## 2018-12-13 NOTE — Patient Instructions (Addendum)
Influenza, Adult -Take medication as prescribed. -Ibuprofen 800 mg every 8 hours for the next 48 hours.  Take this medication with food and water to protect her stomach lining.  This will help with body aches and fever. -Increase fluids. -Plenty of rest. -Drink pineapple juice to help with cough. -Sleep elevated on at least 2 pillows at bedtime to help with cough. -Use a humidifier or vaporizer when at home and during sleep. -May use a teaspoon of honey or over-the-counter cough drops to help with cough. -May use normal saline nasal spray to help with nasal congestion throughout the day. -Remain home until your fever free for at least 24 hours. -Apply ice to the neck for the next 48 hours for neck pain, then switch to heat for 48 hours. Ibuprofen will also help with neck pain. Follow up with your PCP if symptoms do not improve. -Work note provided for 2 days.  Patient can return to work on Thursday, December 15, 2018 or sooner if symptoms improve.  Influenza, more commonly known as "the flu," is a viral infection that mainly affects the respiratory tract. The respiratory tract includes organs that help you breathe, such as the lungs, nose, and throat. The flu causes many symptoms similar to the common cold along with high fever and body aches. The flu spreads easily from person to person (is contagious). Getting a flu shot (influenza vaccination) every year is the best way to prevent the flu. What are the causes? This condition is caused by the influenza virus. You can get the virus by:  Breathing in droplets that are in the air from an infected person's cough or sneeze.  Touching something that has been exposed to the virus (has been contaminated) and then touching your mouth, nose, or eyes. What increases the risk? The following factors may make you more likely to get the flu:  Not washing or sanitizing your hands often.  Having close contact with many people during cold and flu  season.  Touching your mouth, eyes, or nose without first washing or sanitizing your hands.  Not getting a yearly (annual) flu shot. You may have a higher risk for the flu, including serious problems such as a lung infection (pneumonia), if you:  Are older than 65.  Are pregnant.  Have a weakened disease-fighting system (immune system). You may have a weakened immune system if you: ? Have HIV or AIDS. ? Are undergoing chemotherapy. ? Are taking medicines that reduce (suppress) the activity of your immune system.  Have a long-term (chronic) illness, such as heart disease, kidney disease, diabetes, or lung disease.  Have a liver disorder.  Are severely overweight (morbidly obese).  Have anemia. This is a condition that affects your red blood cells.  Have asthma. What are the signs or symptoms? Symptoms of this condition usually begin suddenly and last 4-14 days. They may include:  Fever and chills.  Headaches, body aches, or muscle aches.  Sore throat.  Cough.  Runny or stuffy (congested) nose.  Chest discomfort.  Poor appetite.  Weakness or fatigue.  Dizziness.  Nausea or vomiting. How is this diagnosed? This condition may be diagnosed based on:  Your symptoms and medical history.  A physical exam.  Swabbing your nose or throat and testing the fluid for the influenza virus. How is this treated? If the flu is diagnosed early, you can be treated with medicine that can help reduce how severe the illness is and how long it lasts (antiviral medicine). This  may be given by mouth (orally) or through an IV. Taking care of yourself at home can help relieve symptoms. Your health care provider may recommend:  Taking over-the-counter medicines.  Drinking plenty of fluids. In many cases, the flu goes away on its own. If you have severe symptoms or complications, you may be treated in a hospital. Follow these instructions at home: Activity  Rest as needed and get  plenty of sleep.  Stay home from work or school as told by your health care provider. Unless you are visiting your health care provider, avoid leaving home until your fever has been gone for 24 hours without taking medicine. Eating and drinking  Take an oral rehydration solution (ORS). This is a drink that is sold at pharmacies and retail stores.  Drink enough fluid to keep your urine pale yellow.  Drink clear fluids in small amounts as you are able. Clear fluids include water, ice chips, diluted fruit juice, and low-calorie sports drinks.  Eat bland, easy-to-digest foods in small amounts as you are able. These foods include bananas, applesauce, rice, lean meats, toast, and crackers.  Avoid drinking fluids that contain a lot of sugar or caffeine, such as energy drinks, regular sports drinks, and soda.  Avoid alcohol.  Avoid spicy or fatty foods. General instructions      Take over-the-counter and prescription medicines only as told by your health care provider.  Use a cool mist humidifier to add humidity to the air in your home. This can make it easier to breathe.  Cover your mouth and nose when you cough or sneeze.  Wash your hands with soap and water often, especially after you cough or sneeze. If soap and water are not available, use alcohol-based hand sanitizer.  Keep all follow-up visits as told by your health care provider. This is important. How is this prevented?   Get an annual flu shot. You may get the flu shot in late summer, fall, or winter. Ask your health care provider when you should get your flu shot.  Avoid contact with people who are sick during cold and flu season. This is generally fall and winter. Contact a health care provider if:  You develop new symptoms.  You have: ? Chest pain. ? Diarrhea. ? A fever.  Your cough gets worse.  You produce more mucus.  You feel nauseous or you vomit. Get help right away if:  You develop shortness of breath  or difficulty breathing.  Your skin or nails turn a bluish color.  You have severe pain or stiffness in your neck.  You develop a sudden headache or sudden pain in your face or ear.  You cannot eat or drink without vomiting. Summary  Influenza, more commonly known as "the flu," is a viral infection that primarily affects your respiratory tract.  Symptoms of the flu usually begin suddenly and last 4-14 days.  Getting an annual flu shot is the best way to prevent getting the flu.  Stay home from work or school as told by your health care provider. Unless you are visiting your health care provider, avoid leaving home until your fever has been gone for 24 hours without taking medicine.  Keep all follow-up visits as told by your health care provider. This is important. This information is not intended to replace advice given to you by your health care provider. Make sure you discuss any questions you have with your health care provider. Document Released: 10/24/2000 Document Revised: 04/14/2018 Document Reviewed: 04/14/2018  Chartered certified [employer] Patient Education  [employer].

## 2018-12-13 NOTE — Progress Notes (Signed)
Subjective:  Jorge Strong is a 53 y.o. male who presents for evaluation of influenza like symptoms.  Symptoms include achiness, fever: fevers up to 100.3 degrees and cough, nasal congestion, neck pain, fatigue and headache.  Onset of symptoms was 1 day ago, and has been gradually worsening since that time.  The patient denies ear pain, ear drainage, sinus pain, sinus pressure, nausea, vomiting, or abdominal pain.  Treatment to date:  ibuprofen.  High risk factors for influenza complications:  none.     The following portions of the patient's history were reviewed and updated as appropriate:  allergies, current medications and past medical history.  Constitutional: positive for chills, fatigue, fevers and malaise, negative for anorexia, night sweats and sweats Eyes: negative Ears, nose, mouth, throat, and face: positive for nasal congestion and sore throat, negative for ear drainage, earaches and hoarseness Respiratory: positive for cough and sputum, negative for asthma, chronic bronchitis, dyspnea on exertion, stridor and wheezing Cardiovascular: negative Gastrointestinal: negative Musculoskeletal:positive for neck pain, negative for back pain, bone pain, muscle weakness and stiff joints Neurological: positive for headaches, negative for coordination problems, dizziness, gait problems, paresthesia, vertigo and weakness   Objective:  BP 135/75 (BP Location: Right Arm, Patient Position: Sitting, Cuff Size: Normal)   Pulse (!) 105   Temp 99.2 F (37.3 C) (Oral)   Resp 18   Wt 233 lb 9.6 oz (106 kg)   SpO2 97%   BMI 27.70 kg/m  Physical Exam Vitals signs reviewed.  Constitutional:      General: He is not in acute distress.    Appearance: He is well-developed.  HENT:     Head: Normocephalic.     Right Ear: Tympanic membrane, ear canal and external ear normal.     Left Ear: Tympanic membrane, ear canal and external ear normal.     Nose: Congestion (moderate) and rhinorrhea (clear  drainage) present.     Comments: No maxillary or frontal sinus tenderness, turbinates swollen and inflamed. + nasal discharge    Mouth/Throat:     Mouth: Mucous membranes are moist.  Neck:     Musculoskeletal: Full passive range of motion without pain, normal range of motion and neck supple. No neck rigidity, crepitus, injury, pain with movement, spinous process tenderness or muscular tenderness.     Trachea: Trachea normal.  Cardiovascular:     Rate and Rhythm: Regular rhythm. Tachycardia present.     Heart sounds: Normal heart sounds.  Pulmonary:     Effort: Pulmonary effort is normal. No respiratory distress.     Breath sounds: Normal breath sounds. No stridor. No wheezing, rhonchi or rales.  Abdominal:     General: There is no distension.     Palpations: Abdomen is soft.     Tenderness: There is no abdominal tenderness.  Musculoskeletal: Normal range of motion.     Comments: FROM to neck, no stiffness, no point tenderness  Lymphadenopathy:     Cervical: No cervical adenopathy.  Skin:    General: Skin is warm and dry.     Capillary Refill: Capillary refill takes less than 2 seconds.  Neurological:     General: No focal deficit present.     Mental Status: He is alert and oriented to person, place, and time.     Cranial Nerves: No cranial nerve deficit.  Psychiatric:        Mood and Affect: Mood normal.        Behavior: Behavior normal.  Thought Content: Thought content normal.   Oral hydration provided to patient in the office due to his increased heart rate of 105. Repeated patient's heart rate after drinking in the office prior to discharge: 85  Results for orders placed or performed in visit on 12/13/18 (from the past 24 hour(s))  POCT Influenza A/B     Status: Abnormal   Collection Time: 12/13/18 10:18 AM  Result Value Ref Range   Influenza A, POC Positive (A) Negative   Influenza B, POC Negative Negative     Assessment:  Influenza A    Plan:   Exam  findings, diagnosis etiology and medication use and indications reviewed with patient. Follow- Up and discharge instructions provided. No emergent/urgent issues found on exam.  Based on the patient's clinical presentation, symptoms, duration of symptoms and physical exam, patient symptoms are consistent with that of viral etiology.  Patient also had positive influenza A test today.  Patient has opted to treat with Tamiflu for the next 5 days.  There is no concern for bacterial infection as patient does not have any purulent nasal discharge, double sickening, or fever than grade 102.  Patient's vital signs are stable at time of discharge with his heart rate dropping down to 85 after oral hydration in the office.  Encouraged the patient to continue increasing fluids, and symptomatic treatment.  Will prescribe the patient a prescription for Bromfed for his cough and will encourage him to continue using his Nasonex for his nasal congestion.  Also patient's neck pain findings are benign and I think they are related to his influenza.  There is no tenderness, crepitus, or deformity.  Patient also has full range of motion.  However, encouraged the patient that if his symptoms do not improve I would like him to follow-up with his PCP.  Patient education was provided. Patient verbalized understanding of information provided and agrees with plan of care (POC), all questions answered. The patient is advised to call or return to clinic if condition does not see an improvement in symptoms, or to seek the care of the closest emergency department if condition worsens with the above plan.    1. Flu-like symptoms  - POCT Influenza A/B  2. Influenza A  -Take medication as prescribed. -Ibuprofen 800 mg every 8 hours for the next 48 hours.  Take this medication with food and water to protect her stomach lining.  This will help with body aches and fever. -Increase fluids. -Plenty of rest. -Drink pineapple juice to help with  cough. -Sleep elevated on at least 2 pillows at bedtime to help with cough. -Use a humidifier or vaporizer when at home and during sleep. -May use a teaspoon of honey or over-the-counter cough drops to help with cough. -May use normal saline nasal spray to help with nasal congestion throughout the day. -Remain home until your fever free for at least 24 hours. -Apply ice to the neck for the next 48 hours for neck pain, then switch to heat for 48 hours. Ibuprofen will also help with neck pain. Follow up with your PCP if symptoms do not improve. -Work note provided for 2 days.  Patient can return to work on Thursday, December 15, 2018 or sooner if symptoms improve.

## 2018-12-15 ENCOUNTER — Telehealth: Payer: Self-pay

## 2018-12-15 NOTE — Telephone Encounter (Signed)
Patient did not answered the phone. I left a message asking to call us back.  

## 2018-12-17 MED FILL — TAMSULOSIN HCL 0.4 MG CAP: 0.4 | 30 days supply | Qty: 30 | Fill #5

## 2018-12-26 ENCOUNTER — Ambulatory Visit (INDEPENDENT_AMBULATORY_CARE_PROVIDER_SITE_OTHER): Payer: Self-pay | Admitting: Nurse Practitioner

## 2018-12-26 VITALS — BP 124/78 | HR 88 | Temp 98.2°F

## 2018-12-26 DIAGNOSIS — J209 Acute bronchitis, unspecified: Secondary | ICD-10-CM

## 2018-12-26 MED ORDER — DOXYCYCLINE HYCLATE 100 MG PO TABS: 100.0000 mg | ORAL_TABLET | Freq: Two times a day (BID) | ORAL | 0 refills | Status: AC

## 2018-12-26 MED ORDER — ALBUTEROL SULFATE HFA 108 (90 BASE) MCG/ACT IN AERS: 2.0000 | INHALATION_SPRAY | Freq: Four times a day (QID) | RESPIRATORY_TRACT | 0 refills | Status: DC | PRN

## 2018-12-26 MED ORDER — PREDNISONE 10 MG (21) PO TBPK: ORAL_TABLET | ORAL | 0 refills | Status: AC

## 2018-12-26 NOTE — Patient Instructions (Signed)
Acute Bronchitis, Adult -Take medication as prescribed. -Ibuprofen or Tylenol for pain, fever, or general discomfort. -Increase fluids. -May continue taking Mucinex. -Sleep elevated on at least 2 pillows at bedtime to help with cough. -Use a humidifier or vaporizer when at home and during sleep to help with cough. -May use a teaspoon of honey or over-the-counter cough drops to help with cough. -Follow-up with your PCP if symptoms do not improve with this treatment regimen.  It may be that you may need a chest x-ray at that time.   Acute bronchitis is sudden (acute) swelling of the air tubes (bronchi) in the lungs. Acute bronchitis causes these tubes to fill with mucus, which can make it hard to breathe. It can also cause coughing or wheezing. In adults, acute bronchitis usually goes away within 2 weeks. A cough caused by bronchitis may last up to 3 weeks. Smoking, allergies, and asthma can make the condition worse. Repeated episodes of bronchitis may cause further lung problems, such as chronic obstructive pulmonary disease (COPD). What are the causes? This condition can be caused by germs and by substances that irritate the lungs, including:  Cold and flu viruses. This condition is most often caused by the same virus that causes a cold.  Bacteria.  Exposure to tobacco smoke, dust, fumes, and air pollution. What increases the risk? This condition is more likely to develop in people who:  Have close contact with someone with acute bronchitis.  Are exposed to lung irritants, such as tobacco smoke, dust, fumes, and vapors.  Have a weak immune system.  Have a respiratory condition such as asthma. What are the signs or symptoms? Symptoms of this condition include:  A cough.  Coughing up clear, yellow, or green mucus.  Wheezing.  Chest congestion.  Shortness of breath.  A fever.  Body aches.  Chills.  A sore throat. How is this diagnosed? This condition is usually  diagnosed with a physical exam. During the exam, your health care provider may order tests, such as chest X-rays, to rule out other conditions. He or she may also:  Test a sample of your mucus for bacterial infection.  Check the level of oxygen in your blood. This is done to check for pneumonia.  Do a chest X-ray or lung function testing to rule out pneumonia and other conditions.  Perform blood tests. Your health care provider will also ask about your symptoms and medical history. How is this treated? Most cases of acute bronchitis clear up over time without treatment. Your health care provider may recommend:  Drinking more fluids. Drinking more makes your mucus thinner, which may make it easier to breathe.  Taking a medicine for a fever or cough.  Taking an antibiotic medicine.  Using an inhaler to help improve shortness of breath and to control a cough.  Using a cool mist vaporizer or humidifier to make it easier to breathe. Follow these instructions at home: Medicines  Take over-the-counter and prescription medicines only as told by your health care provider.  If you were prescribed an antibiotic, take it as told by your health care provider. Do not stop taking the antibiotic even if you start to feel better. General instructions   Get plenty of rest.  Drink enough fluids to keep your urine pale yellow.  Avoid smoking and secondhand smoke. Exposure to cigarette smoke or irritating chemicals will make bronchitis worse. If you smoke and you need help quitting, ask your health care provider. Quitting smoking will help your  lungs heal faster.  Use an inhaler, cool mist vaporizer, or humidifier as told by your health care provider.  Keep all follow-up visits as told by your health care provider. This is important. How is this prevented? To lower your risk of getting this condition again:  Wash your hands often with soap and water. If soap and water are not available, use  hand sanitizer.  Avoid contact with people who have cold symptoms.  Try not to touch your hands to your mouth, nose, or eyes.  Make sure to get the flu shot every year. Contact a health care provider if:  Your symptoms do not improve in 2 weeks of treatment. Get help right away if:  You cough up blood.  You have chest pain.  You have severe shortness of breath.  You become dehydrated.  You faint or keep feeling like you are going to faint.  You keep vomiting.  You have a severe headache.  Your fever or chills gets worse. This information is not intended to replace advice given to you by your health care provider. Make sure you discuss any questions you have with your health care provider. Document Released: 12/04/2004 Document Revised: 06/10/2017 Document Reviewed: 04/16/2016 Elsevier Interactive Patient Education  2019 [employer].

## 2018-12-26 NOTE — Progress Notes (Signed)
Subjective:     Jorge Strong is a 53 y.o. male here for evaluation of a cough.  The cough is productive of green/yellow sputum, with wheezing, with shortness of breath and is aggravated by nothing. Onset of symptoms was 2 weeks ago, gradually worsening since that time.  Patient was diagnosed with influenza-like symptoms on 12/13/2018 in our clinic, and patient states that since that time he is also been taking Mucinex for his cough.  Patient informs the cough has persisted and he has periods of chest tightness and shortness of breath.  Associated symptoms include shortness of breath, sputum production, wheezing and rhonchi.  Patient denies fever, diaphoresis, tachycardia, or difficulty breathing, patient does not have a history of asthma. Patient has not had recent travel. Patient does not have a history of smoking. Patient  has not had a previous chest x-ray.    The following portions of the patient's history were reviewed and updated as appropriate: allergies, current medications and past medical history.  Past Medical History:  Diagnosis Date  . Closed rib fracture    past hx, recent 3 days ago fell left side"? a new left rib fracture"-has not been confirmed.  Marland Kitchen GERD (gastroesophageal reflux disease)   . H/O seasonal allergies   . Sleep apnea    cpap  . Wound of left leg    left leg wound is healing, but remains draining clear serous-covering over wound.   Review of Systems Constitutional: positive for fatigue, negative for anorexia, chills, malaise, sweats and weight loss Eyes: negative Ears, nose, mouth, throat, and face: negative Respiratory: positive for cough, dyspnea on exertion, sputum and wheezing, negative for asthma, chronic bronchitis, emphysema, hemoptysis and pleurisy/chest pain Cardiovascular: negative Gastrointestinal: negative Neurological: negative     Objective:   BP 124/78 (BP Location: Right Arm, Patient Position: Sitting, Cuff Size: Normal)   Pulse 88   Temp  98.2 F (36.8 C) (Oral)   SpO2 97%   Physical Exam  Constitutional: He is oriented to person, place, and time. He appears well-developed and well-nourished. No distress.  HENT:  Head: Normocephalic.  Right Ear: External ear normal.  Left Ear: External ear normal.  Nose: Nose normal.  Mouth/Throat: Oropharynx is clear and moist. No oropharyngeal exudate.  Eyes: Pupils are equal, round, and reactive to light. Conjunctivae are normal.  Neck: Normal range of motion. Neck supple. No thyromegaly present.  Cardiovascular: Normal rate and regular rhythm.  Respiratory: Effort normal. No respiratory distress. He has wheezes in the right lower field, the left upper field and the left lower field. He has rhonchi in the right lower field, the left upper field and the left lower field.  GI: Normal appearance and bowel sounds are normal. He exhibits no distension.  Lymphadenopathy:    He has no cervical adenopathy.  Neurological: He is alert and oriented to person, place, and time.  Skin: Skin is warm and dry.  Psychiatric: He has a normal mood and affect. His behavior is normal.   Assessment:    Acute Bronchitis    Plan:   Exam findings, diagnosis etiology and medication use and indications reviewed with patient. Follow- Up and discharge instructions provided. No emergent/urgent issues found on exam.  Based on the patient's clinical presentation, symptoms, physical assessment, and duration of symptoms, patient's findings are concerning of bacterial etiology.  Due to the patient's previous diagnosis of influenza, there is concern for occult infection or secondary infection.  I am going to go ahead and treat the  patient with doxycycline to prevent any further development of bacteria within the lungs, or risk for pneumonia.  Additionally, I am going to prescribe the patient a Sterapred Dosepak to help with his lung inflammation.  I have informed the patient that I am covering him in case this is a  bacterial infection.  Patient's vitals are stable, patient is in no acute distress, and lungs TAB.  Instructed the patient that if his symptoms do continue to persist, I would like him to follow-up with his PCP in the event that he may need a chest x-ray.  I am also going to prescribe the patient an albuterol inhaler for his chest tightness. Patient education was provided. Patient verbalized understanding of information provided and agrees with plan of care (POC), all questions answered. The patient is advised to call or return to clinic if condition does not see an improvement in symptoms, or to seek the care of the closest emergency department if condition worsens with the above plan.   1. Acute bronchitis, unspecified organism  - doxycycline (VIBRA-TABS) 100 MG tablet; Take 1 tablet (100 mg total) by mouth 2 (two) times daily for 7 days.  Dispense: 14 tablet; Refill: 0 - predniSONE (STERAPRED UNI-PAK 21 TAB) 10 MG (21) TBPK tablet; Take as directed.  Dispense: 21 tablet; Refill: 0 - albuterol (PROVENTIL HFA;VENTOLIN HFA) 108 (90 Base) MCG/ACT inhaler; Inhale 2 puffs into the lungs every 6 (six) hours as needed for up to 10 days.  Dispense: 1 Inhaler; Refill: 0 -Take medication as prescribed. -Ibuprofen or Tylenol for pain, fever, or general discomfort. -Increase fluids. -May continue taking Mucinex. -Sleep elevated on at least 2 pillows at bedtime to help with cough. -Use a humidifier or vaporizer when at home and during sleep to help with cough. -May use a teaspoon of honey or over-the-counter cough drops to help with cough. -Follow-up with your PCP if symptoms do not improve with this treatment regimen.  It may be that you may need a chest x-ray at that time.

## 2019-01-24 MED FILL — TAMSULOSIN HCL 0.4 MG CAP: 0.4 | 30 days supply | Qty: 30 | Fill #6 | Status: TO

## 2019-02-07 MED FILL — MOMETASONE FUROATE 50 MCG S: 50 | 90 days supply | Qty: 51 | Fill #0

## 2019-02-23 MED FILL — TAMSULOSIN HCL 0.4 MG CAP: 0.4 | 30 days supply | Qty: 30 | Fill #0

## 2019-04-06 MED FILL — TAMSULOSIN HCL 0.4 MG CAP: 0.4 | 30 days supply | Qty: 30 | Fill #1

## 2019-04-11 DIAGNOSIS — R509 Fever, unspecified: Secondary | ICD-10-CM | POA: Diagnosis not present

## 2019-04-20 MED FILL — MOMETASONE FUROATE 50 MCG S: 50 | 90 days supply | Qty: 51 | Fill #1

## 2019-05-10 MED FILL — TAMSULOSIN HCL 0.4 MG CAP: 0.4 | 30 days supply | Qty: 30 | Fill #2

## 2019-05-25 DIAGNOSIS — N401 Enlarged prostate with lower urinary tract symptoms: Secondary | ICD-10-CM | POA: Diagnosis not present

## 2019-05-25 DIAGNOSIS — Z6827 Body mass index (BMI) 27.0-27.9, adult: Secondary | ICD-10-CM | POA: Diagnosis not present

## 2019-05-25 DIAGNOSIS — J309 Allergic rhinitis, unspecified: Secondary | ICD-10-CM | POA: Diagnosis not present

## 2019-05-25 DIAGNOSIS — Z13220 Encounter for screening for lipoid disorders: Secondary | ICD-10-CM | POA: Diagnosis not present

## 2019-05-25 DIAGNOSIS — Z0000 Encounter for general adult medical examination without abnormal findings: Secondary | ICD-10-CM | POA: Diagnosis not present

## 2019-05-25 DIAGNOSIS — N529 Male erectile dysfunction, unspecified: Secondary | ICD-10-CM | POA: Diagnosis not present

## 2019-05-25 DIAGNOSIS — Z125 Encounter for screening for malignant neoplasm of prostate: Secondary | ICD-10-CM | POA: Diagnosis not present

## 2019-06-13 DIAGNOSIS — Z125 Encounter for screening for malignant neoplasm of prostate: Secondary | ICD-10-CM | POA: Diagnosis not present

## 2019-06-13 DIAGNOSIS — Z0000 Encounter for general adult medical examination without abnormal findings: Secondary | ICD-10-CM | POA: Diagnosis not present

## 2019-06-13 DIAGNOSIS — Z13220 Encounter for screening for lipoid disorders: Secondary | ICD-10-CM | POA: Diagnosis not present

## 2019-06-17 MED FILL — TAMSULOSIN HCL 0.4 MG CAP: 0.4 | 90 days supply | Qty: 90 | Fill #0

## 2019-07-15 MED FILL — MOMETASONE FUROATE 50 MCG S: 50 | 90 days supply | Qty: 51 | Fill #0

## 2019-08-19 DIAGNOSIS — S81812A Laceration without foreign body, left lower leg, initial encounter: Secondary | ICD-10-CM | POA: Diagnosis not present

## 2019-09-14 ENCOUNTER — Encounter (HOSPITAL_COMMUNITY): Payer: Self-pay

## 2019-09-30 MED FILL — TAMSULOSIN HCL 0.4 MG CAP: 0.4 | 90 days supply | Qty: 90 | Fill #0

## 2019-10-10 MED FILL — MOMETASONE FUROATE 50 MCG S: 50 | 90 days supply | Qty: 51 | Fill #1

## 2020-01-05 MED FILL — MOMETASONE FUROATE 50 MCG S: 50 | 90 days supply | Qty: 51 | Fill #2

## 2020-01-11 MED FILL — TAMSULOSIN HCL 0.4 MG CAP: 0.4 | 90 days supply | Qty: 90 | Fill #1

## 2020-01-29 DIAGNOSIS — H524 Presbyopia: Secondary | ICD-10-CM | POA: Diagnosis not present

## 2020-04-21 DIAGNOSIS — M62830 Muscle spasm of back: Secondary | ICD-10-CM | POA: Diagnosis not present

## 2020-04-21 DIAGNOSIS — M546 Pain in thoracic spine: Secondary | ICD-10-CM | POA: Diagnosis not present

## 2020-04-23 MED FILL — MOMETASONE FUROATE 50 MCG S: 50 | 90 days supply | Qty: 51 | Fill #3

## 2020-05-28 ENCOUNTER — Other Ambulatory Visit (HOSPITAL_COMMUNITY): Payer: Self-pay | Admitting: Family Medicine

## 2020-05-28 DIAGNOSIS — N401 Enlarged prostate with lower urinary tract symptoms: Secondary | ICD-10-CM | POA: Diagnosis not present

## 2020-05-28 DIAGNOSIS — Z23 Encounter for immunization: Secondary | ICD-10-CM | POA: Diagnosis not present

## 2020-05-28 DIAGNOSIS — Z0000 Encounter for general adult medical examination without abnormal findings: Secondary | ICD-10-CM | POA: Diagnosis not present

## 2020-05-28 DIAGNOSIS — Z6829 Body mass index (BMI) 29.0-29.9, adult: Secondary | ICD-10-CM | POA: Diagnosis not present

## 2020-05-28 DIAGNOSIS — Z125 Encounter for screening for malignant neoplasm of prostate: Secondary | ICD-10-CM | POA: Diagnosis not present

## 2020-05-28 DIAGNOSIS — J309 Allergic rhinitis, unspecified: Secondary | ICD-10-CM | POA: Diagnosis not present

## 2020-05-28 DIAGNOSIS — M549 Dorsalgia, unspecified: Secondary | ICD-10-CM | POA: Diagnosis not present

## 2020-05-28 DIAGNOSIS — Z13220 Encounter for screening for lipoid disorders: Secondary | ICD-10-CM | POA: Diagnosis not present

## 2020-05-28 DIAGNOSIS — N529 Male erectile dysfunction, unspecified: Secondary | ICD-10-CM | POA: Diagnosis not present

## 2020-05-28 MED FILL — TAMSULOSIN HCL 0.4 MG CAP: 0.4 | 90 days supply | Qty: 180 | Fill #0

## 2020-06-05 DIAGNOSIS — M9901 Segmental and somatic dysfunction of cervical region: Secondary | ICD-10-CM | POA: Diagnosis not present

## 2020-06-05 DIAGNOSIS — M9902 Segmental and somatic dysfunction of thoracic region: Secondary | ICD-10-CM | POA: Diagnosis not present

## 2020-06-05 DIAGNOSIS — M791 Myalgia, unspecified site: Secondary | ICD-10-CM | POA: Diagnosis not present

## 2020-06-05 DIAGNOSIS — M5032 Other cervical disc degeneration, mid-cervical region, unspecified level: Secondary | ICD-10-CM | POA: Diagnosis not present

## 2020-06-07 DIAGNOSIS — M5032 Other cervical disc degeneration, mid-cervical region, unspecified level: Secondary | ICD-10-CM | POA: Diagnosis not present

## 2020-06-07 DIAGNOSIS — M9901 Segmental and somatic dysfunction of cervical region: Secondary | ICD-10-CM | POA: Diagnosis not present

## 2020-06-07 DIAGNOSIS — M9902 Segmental and somatic dysfunction of thoracic region: Secondary | ICD-10-CM | POA: Diagnosis not present

## 2020-06-07 DIAGNOSIS — M791 Myalgia, unspecified site: Secondary | ICD-10-CM | POA: Diagnosis not present

## 2020-06-12 DIAGNOSIS — M9902 Segmental and somatic dysfunction of thoracic region: Secondary | ICD-10-CM | POA: Diagnosis not present

## 2020-06-12 DIAGNOSIS — M5032 Other cervical disc degeneration, mid-cervical region, unspecified level: Secondary | ICD-10-CM | POA: Diagnosis not present

## 2020-06-12 DIAGNOSIS — M791 Myalgia, unspecified site: Secondary | ICD-10-CM | POA: Diagnosis not present

## 2020-06-12 DIAGNOSIS — M9901 Segmental and somatic dysfunction of cervical region: Secondary | ICD-10-CM | POA: Diagnosis not present

## 2020-06-16 DIAGNOSIS — M791 Myalgia, unspecified site: Secondary | ICD-10-CM | POA: Diagnosis not present

## 2020-06-16 DIAGNOSIS — M9901 Segmental and somatic dysfunction of cervical region: Secondary | ICD-10-CM | POA: Diagnosis not present

## 2020-06-16 DIAGNOSIS — M9902 Segmental and somatic dysfunction of thoracic region: Secondary | ICD-10-CM | POA: Diagnosis not present

## 2020-06-16 DIAGNOSIS — M5032 Other cervical disc degeneration, mid-cervical region, unspecified level: Secondary | ICD-10-CM | POA: Diagnosis not present

## 2020-06-19 DIAGNOSIS — M9902 Segmental and somatic dysfunction of thoracic region: Secondary | ICD-10-CM | POA: Diagnosis not present

## 2020-06-19 DIAGNOSIS — M5032 Other cervical disc degeneration, mid-cervical region, unspecified level: Secondary | ICD-10-CM | POA: Diagnosis not present

## 2020-06-19 DIAGNOSIS — M9901 Segmental and somatic dysfunction of cervical region: Secondary | ICD-10-CM | POA: Diagnosis not present

## 2020-06-19 DIAGNOSIS — M791 Myalgia, unspecified site: Secondary | ICD-10-CM | POA: Diagnosis not present

## 2020-06-21 DIAGNOSIS — M5032 Other cervical disc degeneration, mid-cervical region, unspecified level: Secondary | ICD-10-CM | POA: Diagnosis not present

## 2020-06-21 DIAGNOSIS — M791 Myalgia, unspecified site: Secondary | ICD-10-CM | POA: Diagnosis not present

## 2020-06-21 DIAGNOSIS — M9902 Segmental and somatic dysfunction of thoracic region: Secondary | ICD-10-CM | POA: Diagnosis not present

## 2020-06-21 DIAGNOSIS — M9901 Segmental and somatic dysfunction of cervical region: Secondary | ICD-10-CM | POA: Diagnosis not present

## 2020-06-26 DIAGNOSIS — M9902 Segmental and somatic dysfunction of thoracic region: Secondary | ICD-10-CM | POA: Diagnosis not present

## 2020-06-26 DIAGNOSIS — M9901 Segmental and somatic dysfunction of cervical region: Secondary | ICD-10-CM | POA: Diagnosis not present

## 2020-06-26 DIAGNOSIS — M5032 Other cervical disc degeneration, mid-cervical region, unspecified level: Secondary | ICD-10-CM | POA: Diagnosis not present

## 2020-06-26 DIAGNOSIS — M791 Myalgia, unspecified site: Secondary | ICD-10-CM | POA: Diagnosis not present

## 2020-06-28 DIAGNOSIS — M791 Myalgia, unspecified site: Secondary | ICD-10-CM | POA: Diagnosis not present

## 2020-06-28 DIAGNOSIS — M9901 Segmental and somatic dysfunction of cervical region: Secondary | ICD-10-CM | POA: Diagnosis not present

## 2020-06-28 DIAGNOSIS — M9902 Segmental and somatic dysfunction of thoracic region: Secondary | ICD-10-CM | POA: Diagnosis not present

## 2020-06-28 DIAGNOSIS — M5032 Other cervical disc degeneration, mid-cervical region, unspecified level: Secondary | ICD-10-CM | POA: Diagnosis not present

## 2020-07-03 DIAGNOSIS — M9901 Segmental and somatic dysfunction of cervical region: Secondary | ICD-10-CM | POA: Diagnosis not present

## 2020-07-03 DIAGNOSIS — M791 Myalgia, unspecified site: Secondary | ICD-10-CM | POA: Diagnosis not present

## 2020-07-03 DIAGNOSIS — M5032 Other cervical disc degeneration, mid-cervical region, unspecified level: Secondary | ICD-10-CM | POA: Diagnosis not present

## 2020-07-03 DIAGNOSIS — M9902 Segmental and somatic dysfunction of thoracic region: Secondary | ICD-10-CM | POA: Diagnosis not present

## 2020-07-05 DIAGNOSIS — M9902 Segmental and somatic dysfunction of thoracic region: Secondary | ICD-10-CM | POA: Diagnosis not present

## 2020-07-05 DIAGNOSIS — M5032 Other cervical disc degeneration, mid-cervical region, unspecified level: Secondary | ICD-10-CM | POA: Diagnosis not present

## 2020-07-05 DIAGNOSIS — M9901 Segmental and somatic dysfunction of cervical region: Secondary | ICD-10-CM | POA: Diagnosis not present

## 2020-07-05 DIAGNOSIS — M791 Myalgia, unspecified site: Secondary | ICD-10-CM | POA: Diagnosis not present

## 2020-07-12 MED FILL — MOMETASONE FUROATE 50 MCG S: 50 | 90 days supply | Qty: 51 | Fill #0

## 2020-08-16 DIAGNOSIS — M25511 Pain in right shoulder: Secondary | ICD-10-CM | POA: Diagnosis not present

## 2020-08-16 DIAGNOSIS — R58 Hemorrhage, not elsewhere classified: Secondary | ICD-10-CM | POA: Diagnosis not present

## 2020-08-16 DIAGNOSIS — S46911A Strain of unspecified muscle, fascia and tendon at shoulder and upper arm level, right arm, initial encounter: Secondary | ICD-10-CM | POA: Diagnosis not present

## 2020-08-30 DIAGNOSIS — S46011A Strain of muscle(s) and tendon(s) of the rotator cuff of right shoulder, initial encounter: Secondary | ICD-10-CM | POA: Diagnosis not present

## 2020-09-04 DIAGNOSIS — M25511 Pain in right shoulder: Secondary | ICD-10-CM | POA: Diagnosis not present

## 2020-09-05 ENCOUNTER — Encounter (HOSPITAL_COMMUNITY): Payer: Self-pay

## 2020-09-19 ENCOUNTER — Other Ambulatory Visit (HOSPITAL_COMMUNITY): Payer: Self-pay | Admitting: Orthopaedic Surgery

## 2020-09-19 MED FILL — MELOXICAM 7.5 MG TABLET: 7.5 | 30 days supply | Qty: 30 | Fill #0

## 2020-10-03 MED FILL — TAMSULOSIN HCL 0.4 MG CAP: 0.4 | 90 days supply | Qty: 180 | Fill #1

## 2020-10-03 MED FILL — MOMETASONE FUROATE 50 MCG S: 50 | 90 days supply | Qty: 51 | Fill #1

## 2020-10-11 ENCOUNTER — Ambulatory Visit (HOSPITAL_BASED_OUTPATIENT_CLINIC_OR_DEPARTMENT_OTHER): Admit: 2020-10-11 | Payer: 59 | Admitting: Orthopaedic Surgery

## 2020-10-11 ENCOUNTER — Encounter (HOSPITAL_BASED_OUTPATIENT_CLINIC_OR_DEPARTMENT_OTHER): Payer: Self-pay

## 2020-10-11 SURGERY — SHOULDER ARTHROSCOPY WITH ROTATOR CUFF REPAIR AND SUBACROMIAL DECOMPRESSION
Anesthesia: Choice | Laterality: Right

## 2020-11-20 ENCOUNTER — Other Ambulatory Visit: Payer: Self-pay | Admitting: Orthopaedic Surgery

## 2020-11-20 DIAGNOSIS — M75101 Unspecified rotator cuff tear or rupture of right shoulder, not specified as traumatic: Secondary | ICD-10-CM

## 2020-11-22 ENCOUNTER — Encounter (HOSPITAL_BASED_OUTPATIENT_CLINIC_OR_DEPARTMENT_OTHER): Payer: Self-pay | Admitting: Orthopaedic Surgery

## 2020-11-22 ENCOUNTER — Other Ambulatory Visit: Payer: Self-pay

## 2020-11-24 ENCOUNTER — Ambulatory Visit (HOSPITAL_COMMUNITY)
Admission: RE | Admit: 2020-11-24 | Discharge: 2020-11-24 | Disposition: A | Discharge status: Home / Self Care | Payer: No Typology Code available for payment source | Source: Ambulatory Visit | Attending: Orthopaedic Surgery | Admitting: Orthopaedic Surgery

## 2020-11-24 ENCOUNTER — Other Ambulatory Visit: Payer: Self-pay

## 2020-11-24 DIAGNOSIS — M75101 Unspecified rotator cuff tear or rupture of right shoulder, not specified as traumatic: Secondary | ICD-10-CM | POA: Diagnosis not present

## 2020-11-26 ENCOUNTER — Other Ambulatory Visit (HOSPITAL_COMMUNITY): Payer: No Typology Code available for payment source

## 2020-11-27 ENCOUNTER — Other Ambulatory Visit (HOSPITAL_COMMUNITY)
Admission: RE | Admit: 2020-11-27 | Discharge: 2020-11-27 | Disposition: A | Discharge status: Home / Self Care | Payer: No Typology Code available for payment source | Source: Ambulatory Visit | Attending: Orthopaedic Surgery | Admitting: Orthopaedic Surgery

## 2020-11-27 DIAGNOSIS — Z01812 Encounter for preprocedural laboratory examination: Secondary | ICD-10-CM | POA: Diagnosis present

## 2020-11-27 DIAGNOSIS — Z20822 Contact with and (suspected) exposure to covid-19: Secondary | ICD-10-CM | POA: Diagnosis not present

## 2020-11-27 NOTE — Progress Notes (Signed)
      Enhanced Recovery after Surgery for Orthopedics Enhanced Recovery after Surgery is a protocol used to improve the stress on your body and your recovery after surgery.  Patient Instructions  . The night before surgery:  o No food after midnight. ONLY clear liquids after midnight  . The day of surgery (if you do NOT have diabetes):  o Drink ONE (1) Pre-Surgery Clear Ensure as directed.   o This drink was given to you during your hospital  pre-op appointment visit. o The pre-op nurse will instruct you on the time to drink the  Pre-Surgery Ensure depending on your surgery time. o Finish the drink at the designated time by the pre-op nurse.  o Nothing else to drink after completing the  Pre-Surgery Clear Ensure.  . The day of surgery (if you have diabetes): o Drink ONE (1) Gatorade 2 (G2) as directed. o This drink was given to you during your hospital  pre-op appointment visit.  o The pre-op nurse will instruct you on the time to drink the   Gatorade 2 (G2) depending on your surgery time. o Color of the Gatorade may vary. Red is not allowed. o Nothing else to drink after completing the  Gatorade 2 (G2).         If you have questions, please contact your surgeon's office.  Surgical soap and BPO given to patient with instructions for use.  Patient verbalized understanding of instructions.

## 2020-11-28 LAB — SARS CORONAVIRUS 2 (TAT 6-24 HRS): SARS Coronavirus 2: NEGATIVE

## 2020-11-28 NOTE — Anesthesia Preprocedure Evaluation (Addendum)
Anesthesia Evaluation  Patient identified by MRN, date of birth, ID band Patient awake    Reviewed: Allergy & Precautions, H&P , NPO status , Patient's Chart, lab work & pertinent test results  History of Anesthesia Complications Negative for: history of anesthetic complications  Airway Mallampati: II  TM Distance: >3 FB Neck ROM: Full    Dental no notable dental hx. (+) Dental Advisory Given   Pulmonary shortness of breath, sleep apnea ,    Pulmonary exam normal        Cardiovascular negative cardio ROS Normal cardiovascular exam     Neuro/Psych    GI/Hepatic GERD  ,  Endo/Other  Morbid obesity  Renal/GU      Musculoskeletal   Abdominal   Peds  Hematology   Anesthesia Other Findings   Reproductive/Obstetrics                            Anesthesia Physical  Anesthesia Plan  ASA: II  Anesthesia Plan: General   Post-op Pain Management:  Regional for Post-op pain   Induction: Intravenous  PONV Risk Score and Plan: 2 and Ondansetron and Dexamethasone  Airway Management Planned: Oral ETT  Additional Equipment:   Intra-op Plan:   Post-operative Plan: Extubation in OR  Informed Consent: I have reviewed the patients History and Physical, chart, labs and discussed the procedure including the risks, benefits and alternatives for the proposed anesthesia with the patient or authorized representative who has indicated his/her understanding and acceptance.     Dental advisory given  Plan Discussed with: Anesthesiologist and CRNA  Anesthesia Plan Comments:        Anesthesia Quick Evaluation

## 2020-11-29 ENCOUNTER — Ambulatory Visit (HOSPITAL_BASED_OUTPATIENT_CLINIC_OR_DEPARTMENT_OTHER): Payer: No Typology Code available for payment source | Admitting: Certified Registered"

## 2020-11-29 ENCOUNTER — Other Ambulatory Visit (HOSPITAL_BASED_OUTPATIENT_CLINIC_OR_DEPARTMENT_OTHER): Payer: Self-pay | Admitting: Physician Assistant

## 2020-11-29 ENCOUNTER — Other Ambulatory Visit: Payer: Self-pay

## 2020-11-29 ENCOUNTER — Encounter (HOSPITAL_BASED_OUTPATIENT_CLINIC_OR_DEPARTMENT_OTHER)
Admission: RE | Disposition: A | Discharge status: Home / Self Care | Payer: Self-pay | Source: Home / Self Care | Attending: Orthopaedic Surgery

## 2020-11-29 ENCOUNTER — Encounter (HOSPITAL_BASED_OUTPATIENT_CLINIC_OR_DEPARTMENT_OTHER): Payer: Self-pay | Admitting: Orthopaedic Surgery

## 2020-11-29 ENCOUNTER — Ambulatory Visit (HOSPITAL_BASED_OUTPATIENT_CLINIC_OR_DEPARTMENT_OTHER)
Admission: RE | Admit: 2020-11-29 | Discharge: 2020-11-29 | Disposition: A | Discharge status: Home / Self Care | Payer: No Typology Code available for payment source | Attending: Orthopaedic Surgery | Admitting: Orthopaedic Surgery

## 2020-11-29 DIAGNOSIS — S43431A Superior glenoid labrum lesion of right shoulder, initial encounter: Secondary | ICD-10-CM | POA: Insufficient documentation

## 2020-11-29 DIAGNOSIS — Z79899 Other long term (current) drug therapy: Secondary | ICD-10-CM | POA: Diagnosis not present

## 2020-11-29 DIAGNOSIS — W19XXXA Unspecified fall, initial encounter: Secondary | ICD-10-CM | POA: Diagnosis not present

## 2020-11-29 DIAGNOSIS — S46011A Strain of muscle(s) and tendon(s) of the rotator cuff of right shoulder, initial encounter: Secondary | ICD-10-CM | POA: Insufficient documentation

## 2020-11-29 HISTORY — PX: BICEPT TENODESIS: SHX5116

## 2020-11-29 HISTORY — PX: SHOULDER ARTHROSCOPY WITH ROTATOR CUFF REPAIR AND SUBACROMIAL DECOMPRESSION: SHX5686

## 2020-11-29 HISTORY — DX: Benign prostatic hyperplasia without lower urinary tract symptoms: N40.0

## 2020-11-29 SURGERY — SHOULDER ARTHROSCOPY WITH ROTATOR CUFF REPAIR AND SUBACROMIAL DECOMPRESSION
Anesthesia: General | Site: Shoulder | Laterality: Right

## 2020-11-29 MED ORDER — ONDANSETRON HCL 4 MG/2ML IJ SOLN
INTRAMUSCULAR | Status: DC | PRN
  Administered 2020-11-29: 4 mg via INTRAVENOUS

## 2020-11-29 MED ORDER — PROPOFOL 10 MG/ML IV BOLUS
INTRAVENOUS | Status: DC | PRN
  Administered 2020-11-29: 50 mg via INTRAVENOUS
  Administered 2020-11-29: 150 mg via INTRAVENOUS

## 2020-11-29 MED ORDER — DEXAMETHASONE SODIUM PHOSPHATE 10 MG/ML IJ SOLN
INTRAMUSCULAR | Status: AC
  Filled 2020-11-29: qty 1

## 2020-11-29 MED ORDER — SODIUM CHLORIDE 0.9 % IR SOLN
Status: DC | PRN
  Administered 2020-11-29: 12000 mL

## 2020-11-29 MED ORDER — SODIUM CHLORIDE 0.9 % IR SOLN
Status: DC | PRN
  Administered 2020-11-29: 18000 mL

## 2020-11-29 MED ORDER — FENTANYL CITRATE (PF) 100 MCG/2ML IJ SOLN
INTRAMUSCULAR | Status: DC | PRN
  Administered 2020-11-29: 100 ug via INTRAVENOUS

## 2020-11-29 MED ORDER — ROCURONIUM BROMIDE 100 MG/10ML IV SOLN
INTRAVENOUS | Status: DC | PRN
  Administered 2020-11-29: 100 mg via INTRAVENOUS

## 2020-11-29 MED ORDER — CELECOXIB 200 MG PO CAPS
ORAL_CAPSULE | ORAL | Status: AC
  Filled 2020-11-29: qty 1

## 2020-11-29 MED ORDER — FENTANYL CITRATE (PF) 100 MCG/2ML IJ SOLN
100.0000 ug | Freq: Once | INTRAMUSCULAR | Status: AC
  Administered 2020-11-29: 100 ug via INTRAVENOUS

## 2020-11-29 MED ORDER — ACETAMINOPHEN 500 MG PO TABS: 1000.0000 mg | ORAL_TABLET | Freq: Three times a day (TID) | ORAL | 0 refills | Status: DC

## 2020-11-29 MED ORDER — LIDOCAINE HCL (CARDIAC) PF 100 MG/5ML IV SOSY: PREFILLED_SYRINGE | INTRAVENOUS | Status: DC | PRN

## 2020-11-29 MED ORDER — BUPIVACAINE HCL (PF) 0.5 % IJ SOLN
INTRAMUSCULAR | Status: DC | PRN
  Administered 2020-11-29: 15 mL via PERINEURAL

## 2020-11-29 MED ORDER — PHENYLEPHRINE 40 MCG/ML (10ML) SYRINGE FOR IV PUSH (FOR BLOOD PRESSURE SUPPORT)
PREFILLED_SYRINGE | INTRAVENOUS | Status: AC
  Filled 2020-11-29: qty 30

## 2020-11-29 MED ORDER — CELECOXIB 100 MG PO CAPS: 100.0000 mg | ORAL_CAPSULE | Freq: Two times a day (BID) | ORAL | 0 refills | Status: DC

## 2020-11-29 MED ORDER — CELECOXIB 200 MG PO CAPS
200.0000 mg | ORAL_CAPSULE | Freq: Once | ORAL | Status: AC
  Administered 2020-11-29: 200 mg via ORAL

## 2020-11-29 MED ORDER — CEFAZOLIN SODIUM-DEXTROSE 2-4 GM/100ML-% IV SOLN
INTRAVENOUS | Status: AC
  Filled 2020-11-29: qty 100

## 2020-11-29 MED ORDER — SUGAMMADEX SODIUM 500 MG/5ML IV SOLN
INTRAVENOUS | Status: AC
  Filled 2020-11-29: qty 5

## 2020-11-29 MED ORDER — AMISULPRIDE (ANTIEMETIC) 5 MG/2ML IV SOLN: 10.0000 mg | Freq: Once | INTRAVENOUS | Status: DC | PRN

## 2020-11-29 MED ORDER — EPINEPHRINE PF 1 MG/ML IJ SOLN
INTRAMUSCULAR | Status: AC
  Filled 2020-11-29: qty 8

## 2020-11-29 MED ORDER — PHENYLEPHRINE HCL (PRESSORS) 10 MG/ML IV SOLN
INTRAVENOUS | Status: DC | PRN
  Administered 2020-11-29 (×9): 40 ug via INTRAVENOUS

## 2020-11-29 MED ORDER — ACETAMINOPHEN 500 MG PO TABS
ORAL_TABLET | ORAL | Status: AC
  Filled 2020-11-29: qty 2

## 2020-11-29 MED ORDER — CEFAZOLIN SODIUM-DEXTROSE 2-4 GM/100ML-% IV SOLN
2.0000 g | INTRAVENOUS | Status: AC
  Administered 2020-11-29: 2 g via INTRAVENOUS

## 2020-11-29 MED ORDER — ONDANSETRON HCL 4 MG PO TABS: 4.0000 mg | ORAL_TABLET | Freq: Three times a day (TID) | ORAL | 0 refills | Status: DC | PRN

## 2020-11-29 MED ORDER — FENTANYL CITRATE (PF) 100 MCG/2ML IJ SOLN
INTRAMUSCULAR | Status: AC
  Filled 2020-11-29: qty 2

## 2020-11-29 MED ORDER — ACETAMINOPHEN 500 MG PO TABS
1000.0000 mg | ORAL_TABLET | Freq: Once | ORAL | Status: AC
  Administered 2020-11-29: 1000 mg via ORAL

## 2020-11-29 MED ORDER — GABAPENTIN 100 MG PO CAPS: 100.0000 mg | ORAL_CAPSULE | Freq: Two times a day (BID) | ORAL | 0 refills | Status: DC

## 2020-11-29 MED ORDER — BUPIVACAINE LIPOSOME 1.3 % IJ SUSP
INTRAMUSCULAR | Status: DC | PRN
  Administered 2020-11-29: 10 mL via PERINEURAL

## 2020-11-29 MED ORDER — FENTANYL CITRATE (PF) 100 MCG/2ML IJ SOLN: 25.0000 ug | INTRAMUSCULAR | Status: DC | PRN

## 2020-11-29 MED ORDER — MIDAZOLAM HCL 2 MG/2ML IJ SOLN
INTRAMUSCULAR | Status: AC
  Filled 2020-11-29: qty 2

## 2020-11-29 MED ORDER — DEXAMETHASONE SODIUM PHOSPHATE 4 MG/ML IJ SOLN
INTRAMUSCULAR | Status: DC | PRN
  Administered 2020-11-29: 10 mg via INTRAVENOUS

## 2020-11-29 MED ORDER — ONDANSETRON HCL 4 MG/2ML IJ SOLN
INTRAMUSCULAR | Status: AC
  Filled 2020-11-29: qty 2

## 2020-11-29 MED ORDER — ROCURONIUM BROMIDE 10 MG/ML (PF) SYRINGE
PREFILLED_SYRINGE | INTRAVENOUS | Status: AC
  Filled 2020-11-29: qty 10

## 2020-11-29 MED ORDER — PROPOFOL 10 MG/ML IV BOLUS
INTRAVENOUS | Status: AC
  Filled 2020-11-29: qty 40

## 2020-11-29 MED ORDER — LIDOCAINE 2% (20 MG/ML) 5 ML SYRINGE
INTRAMUSCULAR | Status: AC
  Filled 2020-11-29: qty 5

## 2020-11-29 MED ORDER — EPHEDRINE SULFATE 50 MG/ML IJ SOLN
INTRAMUSCULAR | Status: DC | PRN
  Administered 2020-11-29 (×3): 5 mg via INTRAVENOUS

## 2020-11-29 MED ORDER — OXYCODONE HCL 5 MG PO TABS: ORAL_TABLET | ORAL | 0 refills | Status: DC

## 2020-11-29 MED ORDER — MIDAZOLAM HCL 2 MG/2ML IJ SOLN
2.0000 mg | Freq: Once | INTRAMUSCULAR | Status: AC
  Administered 2020-11-29: 2 mg via INTRAVENOUS

## 2020-11-29 MED ORDER — LACTATED RINGERS IV SOLN: INTRAVENOUS | Status: DC

## 2020-11-29 MED ORDER — SUGAMMADEX SODIUM 200 MG/2ML IV SOLN
INTRAVENOUS | Status: DC | PRN
  Administered 2020-11-29: 239.4 mg via INTRAVENOUS

## 2020-11-29 MED ORDER — VANCOMYCIN HCL 1000 MG IV SOLR
INTRAVENOUS | Status: AC
  Filled 2020-11-29: qty 1000

## 2020-11-29 MED ORDER — EPHEDRINE 5 MG/ML INJ
INTRAVENOUS | Status: AC
  Filled 2020-11-29: qty 50

## 2020-11-29 MED ORDER — PHENYLEPHRINE 40 MCG/ML (10ML) SYRINGE FOR IV PUSH (FOR BLOOD PRESSURE SUPPORT)
PREFILLED_SYRINGE | INTRAVENOUS | Status: AC
  Filled 2020-11-29: qty 10

## 2020-11-29 MED FILL — oxyCODONE HCL 5 MG TABS: 5 | 4 days supply | Qty: 24 | Fill #0

## 2020-11-29 MED FILL — CELECOXIB 100 MG CAPS: 100 | 30 days supply | Qty: 60 | Fill #0

## 2020-11-29 MED FILL — GABAPENTIN 100 MG CAPSULE: 100 | 14 days supply | Qty: 28 | Fill #0

## 2020-11-29 MED FILL — ONDANSETRON HCL 4 MG TABS: 4 | 3 days supply | Qty: 10 | Fill #0

## 2020-11-29 SURGICAL SUPPLY — 71 items
AID PSTN UNV HD RSTRNT DISP (MISCELLANEOUS) ×2
ANCHOR SUT 1.8 FBRTK KNTLS 2SU (Anchor) ×15 IMPLANT
APL PRP STRL LF DISP 70% ISPRP (MISCELLANEOUS) ×2
BLADE EXCALIBUR 4.0X13 (MISCELLANEOUS) ×3 IMPLANT
BLADE SURG 15 STRL LF DISP TIS (BLADE) ×2 IMPLANT
BLADE SURG 15 STRL SS (BLADE) ×3
BNDG COHESIVE 4X5 TAN STRL (GAUZE/BANDAGES/DRESSINGS) ×3 IMPLANT
BURR OVAL 8 FLU 4.0X13 (MISCELLANEOUS) ×3 IMPLANT
CANNULA 5.75X71 LONG (CANNULA) IMPLANT
CANNULA PASSPORT 5 (CANNULA) IMPLANT
CANNULA PASSPORT BUTTON (MISCELLANEOUS) ×2 IMPLANT
CANNULA PASSPORT BUTTON 10-40 (CANNULA) ×3 IMPLANT
CANNULA TWIST IN 8.25X7CM (CANNULA) IMPLANT
CHLORAPREP W/TINT 26 (MISCELLANEOUS) ×3 IMPLANT
CLSR STERI-STRIP ANTIMIC 1/2X4 (GAUZE/BANDAGES/DRESSINGS) ×6 IMPLANT
COOLER ICEMAN CLASSIC (MISCELLANEOUS) ×3 IMPLANT
COVER WAND RF STERILE (DRAPES) IMPLANT
DIVIDER PASSPORT (MISCELLANEOUS) ×3 IMPLANT
DRAPE IMP U-DRAPE 54X76 (DRAPES) ×3 IMPLANT
DRAPE INCISE IOBAN 66X45 STRL (DRAPES) IMPLANT
DRAPE SHOULDER BEACH CHAIR (DRAPES) ×3 IMPLANT
DRSG PAD ABDOMINAL 8X10 ST (GAUZE/BANDAGES/DRESSINGS) ×3 IMPLANT
DW OUTFLOW CASSETTE/TUBE SET (MISCELLANEOUS) ×3 IMPLANT
GAUZE SPONGE 4X4 12PLY STRL (GAUZE/BANDAGES/DRESSINGS) ×3 IMPLANT
GLOVE BIOGEL M 7.0 STRL (GLOVE) ×6 IMPLANT
GLOVE ECLIPSE 6.5 STRL STRAW (GLOVE) ×3 IMPLANT
GLOVE ECLIPSE 8.0 STRL XLNG CF (GLOVE) ×3 IMPLANT
GLOVE SRG 8 PF TXTR STRL LF DI (GLOVE) ×2 IMPLANT
GLOVE SURG ENC MOIS LTX SZ6.5 (GLOVE) ×3 IMPLANT
GLOVE SURG UNDER POLY LF SZ6.5 (GLOVE) ×3 IMPLANT
GLOVE SURG UNDER POLY LF SZ8 (GLOVE) ×3
GOWN STRL REUS W/ TWL LRG LVL3 (GOWN DISPOSABLE) ×4 IMPLANT
GOWN STRL REUS W/TWL LRG LVL3 (GOWN DISPOSABLE) ×6
GOWN STRL REUS W/TWL XL LVL3 (GOWN DISPOSABLE) ×3 IMPLANT
IMPL SPEEDBRIDGE KIT (Orthopedic Implant) ×2 IMPLANT
IMPLANT SPEEDBRIDGE KIT (Orthopedic Implant) ×3 IMPLANT
IV NS IRRIG 3000ML ARTHROMATIC (IV SOLUTION) ×30 IMPLANT
KIT LEG STABILIZATION (KITS) ×3 IMPLANT
KIT STABILIZATION SHOULDER (MISCELLANEOUS) IMPLANT
KIT STR SPEAR 1.8 FBRTK DISP (KITS) ×3 IMPLANT
LASSO 90 CVE QUICKPAS (DISPOSABLE) IMPLANT
LASSO CRESCENT QUICKPASS (SUTURE) ×3 IMPLANT
MANIFOLD NEPTUNE II (INSTRUMENTS) ×3 IMPLANT
NDL SAFETY ECLIPSE 18X1.5 (NEEDLE) ×2 IMPLANT
NEEDLE HYPO 18GX1.5 SHARP (NEEDLE) ×3
NEEDLE SCORPION MULTI FIRE (NEEDLE) ×3 IMPLANT
PACK ARTHROSCOPY DSU (CUSTOM PROCEDURE TRAY) ×3 IMPLANT
PACK BASIN DAY SURGERY FS (CUSTOM PROCEDURE TRAY) ×3 IMPLANT
PAD COLD SHLDR SM WRAP-ON (PAD) ×3 IMPLANT
PAD COLD SHLDR WRAP-ON (PAD) IMPLANT
PAD ORTHO SHOULDER 7X19 LRG (SOFTGOODS) ×3 IMPLANT
PASSPORT BUTTON CANNULA (MISCELLANEOUS) ×3
PORT APPOLLO RF 90DEGREE MULTI (SURGICAL WAND) ×3 IMPLANT
RESTRAINT HEAD UNIVERSAL NS (MISCELLANEOUS) ×3 IMPLANT
SHEET MEDIUM DRAPE 40X70 STRL (DRAPES) ×3 IMPLANT
SLEEVE SCD COMPRESS KNEE MED (MISCELLANEOUS) ×3 IMPLANT
SLING ARM FOAM STRAP LRG (SOFTGOODS) IMPLANT
SUT FIBERWIRE #2 38 T-5 BLUE (SUTURE) ×3
SUT MNCRL AB 4-0 PS2 18 (SUTURE) ×6 IMPLANT
SUT PDS AB 1 CT  36 (SUTURE)
SUT PDS AB 1 CT 36 (SUTURE) IMPLANT
SUT TIGER TAPE 7 IN WHITE (SUTURE) IMPLANT
SUTURE FIBERWR #2 38 T-5 BLUE (SUTURE) ×2 IMPLANT
SUTURE TAPE TIGERLINK 1.3MM BL (SUTURE) IMPLANT
SUTURETAPE TIGERLINK 1.3MM BL (SUTURE)
SYR 5ML LL (SYRINGE) ×3 IMPLANT
TAPE FIBER 2MM 7IN #2 BLUE (SUTURE) IMPLANT
TISSUE ARTHOFLEX THICK 3MM (Tissue) ×3 IMPLANT
TOWEL GREEN STERILE FF (TOWEL DISPOSABLE) ×6 IMPLANT
TUBE CONNECTING 20X1/4 (TUBING) ×3 IMPLANT
TUBING ARTHROSCOPY IRRIG 16FT (MISCELLANEOUS) ×3 IMPLANT

## 2020-11-29 NOTE — Transfer of Care (Signed)
Immediate Anesthesia Transfer of Care Note  Patient: Jorge Strong  Procedure(s) Performed: SHOULDER ARTHROSCOPY DEBRIDEMENT  WITH SUPERIOR CAPSULAR RECONSTRUCION  ROTATOR CUFF REPAIR AND SUBACROMIAL DECOMPRESSION (Right Shoulder) BICEPS TENODESIS (Right )  Patient Location: PACU  Anesthesia Type:General  Level of Consciousness: drowsy  Airway & Oxygen Therapy: Patient Spontanous Breathing  Post-op Assessment: Report given to RN and Post -op Vital signs reviewed and stable  Post vital signs: Reviewed and stable  Last Vitals:  Vitals Value Taken Time  BP 126/61 11/29/20 1012  Temp    Pulse 76 11/29/20 1013  Resp 17 11/29/20 1013  SpO2 100 % 11/29/20 1013  Vitals shown include unvalidated device data.  Last Pain:  Vitals:   11/29/20 0644  TempSrc: Oral  PainSc: 0-No pain         Complications: No complications documented.

## 2020-11-29 NOTE — Anesthesia Postprocedure Evaluation (Signed)
Anesthesia Post Note  Patient: Jorge Strong  Procedure(s) Performed: SHOULDER ARTHROSCOPY DEBRIDEMENT  WITH SUPERIOR CAPSULAR RECONSTRUCION  ROTATOR CUFF REPAIR AND SUBACROMIAL DECOMPRESSION (Right Shoulder) BICEPS TENODESIS (Right )     Patient location during evaluation: PACU Anesthesia Type: General Level of consciousness: sedated Pain management: pain level controlled Vital Signs Assessment: post-procedure vital signs reviewed and stable Respiratory status: spontaneous breathing and respiratory function stable Cardiovascular status: stable Postop Assessment: no apparent nausea or vomiting Anesthetic complications: no   No complications documented.  Last Vitals:  Vitals:   11/29/20 1045 11/29/20 1058  BP: 123/69 124/71  Pulse: 75 79  Resp: 17 17  Temp:  36.4 C  SpO2: 97% 99%    Last Pain:  Vitals:   11/29/20 1058  TempSrc:   PainSc: 0-No pain                 Spring San,Biff DANIEL

## 2020-11-29 NOTE — Anesthesia Procedure Notes (Signed)
Procedure Name: Intubation Performed by: Ezequiel Kayser, CRNA Pre-anesthesia Checklist: Patient identified, Emergency Drugs available, Suction available and Patient being monitored Patient Re-evaluated:Patient Re-evaluated prior to induction Oxygen Delivery Method: Circle System Utilized Preoxygenation: Pre-oxygenation with 100% oxygen Induction Type: IV induction Ventilation: Mask ventilation without difficulty and Oral airway inserted - appropriate to patient size Laryngoscope Size: Mac and 5 Grade View: Grade I Tube type: Oral Tube size: 7.0 mm Number of attempts: 1 Airway Equipment and Method: Stylet and Oral airway Placement Confirmation: ETT inserted through vocal cords under direct vision,  positive ETCO2 and breath sounds checked- equal and bilateral Secured at: 22 cm Tube secured with: Tape Dental Injury: Teeth and Oropharynx as per pre-operative assessment  Comments: Eyes taped prior to mask ventilation

## 2020-11-29 NOTE — H&P (Signed)
PREOPERATIVE H&P  Chief Complaint: ROTATOR CUFF STRAIN RIGHT SHOULDER  HPI: Jorge Strong is a 55 y.o. male who is scheduled for, Procedure(s): SHOULDER ARTHROSCOPY DEBRIDEMENT  WITH ROTATOR CUFF REPAIR AND SUBACROMIAL DECOMPRESSION BICEPS TENODESIS.   Patient has a past medical history significant for sleep apnea and GERD.   55 year old who saw Dr. Lyn Hollingshead after a traumatic injury on 07-31-2020.  He fell on his shoulder while doing the [employer].  He had immediate pain.  He had an ultrasound evaluation by Dr. Lyn Hollingshead which demonstrated a cuff tear that looked full thickness.  He is unhappy with his shoulder currently.  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  He is worried about his time out of work.  He is right-hand dominant at baseline.  His symptoms are rated as moderate to severe, and have been worsening.  This is significantly impairing activities of daily living.    Please see clinic note for further details on this patient's care.    He has elected for surgical management.   Past Medical History:  Diagnosis Date  . Closed rib fracture    past hx, recent 3 days ago fell left side"? a new left rib fracture"-has not been confirmed.  . Enlarged prostate   . GERD (gastroesophageal reflux disease)   . H/O seasonal allergies   . Sleep apnea    hx of sleep apnea, no longer an issue after weight loss  . Wound of left leg    left leg wound is healing, but remains draining clear serous-covering over wound.   Past Surgical History:  Procedure Laterality Date  . APPENDECTOMY     age 66  . COLONOSCOPY WITH PROPOFOL N/A 02/05/2017   Procedure: COLONOSCOPY WITH PROPOFOL;  Surgeon: Sherrilyn Rist, MD;  Location: [facility];  Service: Gastroenterology;  Laterality: N/A;  . INGUINAL HERNIA REPAIR Right 11/02/2015   Procedure: LAPAROSCOPIC RIGHT INGUINAL HERNIA REPAIR WITH MESH;  Surgeon: Axel Filler, MD;  Location: [facility]  ORS;  Service: General;  Laterality: Right;  . INSERTION OF MESH Right 11/02/2015   Procedure: INSERTION OF MESH;  Surgeon: Axel Filler, MD;  Location: [facility];  Service: General;  Laterality: Right;  . LAPAROSCOPIC GASTRIC SLEEVE RESECTION N/A 02/09/2017   Procedure: LAPAROSCOPIC GASTRIC SLEEVE RESECTION, UPPER ENDO;  Surgeon: De Blanch Kinsinger, MD;  Location: [facility];  Service: General;  Laterality: N/A;  . UPPER GI ENDOSCOPY  02/09/2017   Procedure: UPPER GI ENDOSCOPY;  Surgeon: De Blanch Kinsinger, MD;  Location: [facility];  Service: General;;   Social History   Socioeconomic History  . Marital status: Married    Spouse name: Not on file  . Number of children: Not on file  . Years of education: Not on file  . Highest education level: Not on file  Occupational History  . Not on file  Tobacco Use  . Smoking status: Never Smoker  . Smokeless tobacco: Never Used  Substance and Sexual Activity  . Alcohol use: Yes    Comment: socially rare  . Drug use: No  . Sexual activity: Yes  Other Topics Concern  . Not on file  Social History Narrative  . Not on file   Social Determinants of Health   Financial Resource Strain: Not on file  Food Insecurity: Not on file  Transportation Needs: Not on file  Physical Activity: Not on file  Stress: Not on file  Social Connections: Not on file  Family History  Problem Relation Age of Onset  . Colon cancer Neg Hx    No Known Allergies Prior to Admission medications   Medication Sig Start Date End Date Taking? Authorizing Provider  cetirizine (ZYRTEC) 10 MG tablet Take 10 mg by mouth daily.   Yes [provider]  doxylamine, Sleep, (UNISOM) 25 MG tablet Take 25 mg by mouth at bedtime as needed for sleep.   Yes [provider]  mometasone (NASONEX) 50 MCG/ACT nasal spray Place 1-2 sprays into the nose daily.    Yes [provider]  Multiple Vitamin (MULTIVITAMIN WITH MINERALS) TABS tablet Take 1 tablet by mouth 2  (two) times daily.   Yes [provider]  tamsulosin (FLOMAX) 0.4 MG CAPS capsule Take 0.4 mg by mouth.   Yes [provider]  Tetrahydrozoline HCl (VISINE OP) Apply 1 drop to eye daily as needed (dry eyes).   Yes [provider]    ROS: All other systems have been reviewed and were otherwise negative with the exception of those mentioned in the HPI and as above.  Physical Exam: General: Alert, no acute distress Cardiovascular: No pedal edema Respiratory: No cyanosis, no use of accessory musculature GI: No organomegaly, abdomen is soft and non-tender Skin: No lesions in the area of chief complaint Neurologic: Sensation intact distally Psychiatric: Patient is competent for consent with normal mood and affect Lymphatic: No axillary or cervical lymphadenopathy  MUSCULOSKELETAL:  Examination of the right shoulder demonstrates active elevation to 90 degrees, passively to 170 degrees.  Cuff strength 4/5 supraspinatus, infraspinatus, and subscapularis.  Positive O'Brien's.  Positive impingement.  Negative AC tenderness to palpation.  Imaging: X-rays reviewed in [place] which demonstrate type 2 acromion, mild AC arthrosis, preserved glenohumeral articulation.  Ultrasound images  by Dr. Lyn Hollingshead which demonstrate a full thickness cuff tear of unknown location.  Assessment: ROTATOR CUFF STRAIN RIGHT SHOULDER  Plan: Plan for Procedure(s): SHOULDER ARTHROSCOPY DEBRIDEMENT  WITH ROTATOR CUFF REPAIR AND SUBACROMIAL DECOMPRESSION BICEPS TENODESIS  The risks benefits and alternatives were discussed with the patient including but not limited to the risks of nonoperative treatment, versus surgical intervention including infection, bleeding, nerve injury,  blood clots, cardiopulmonary complications, morbidity, mortality, among others, and they were willing to proceed.   The patient acknowledged the explanation, agreed to proceed with the plan and consent was signed.    Operative Plan: Right shoulder scope with SAD, BT, RCR Discharge Medications: Celebrex DVT Prophylaxis: None Physical Therapy: Outpatient PT Special Discharge needs: Sling   Vernetta Honey, PA-C  11/29/2020 6:22 AM

## 2020-11-29 NOTE — Interval H&P Note (Signed)
History and Physical Interval Note:  11/29/2020 7:15 AM  Jorge Strong  has presented today for surgery, with the diagnosis of ROTATOR CUFF STRAIN RIGHT SHOULDER.  The various methods of treatment have been discussed with the patient and family. After consideration of risks, benefits and other options for treatment, the patient has consented to  Procedure(s): SHOULDER ARTHROSCOPY DEBRIDEMENT  WITH ROTATOR CUFF REPAIR AND SUBACROMIAL DECOMPRESSION (Right) BICEPS TENODESIS (Right) as a surgical intervention.  The patient's history has been reviewed, patient examined, no change in status, stable for surgery.  I have reviewed the patient's chart and labs.  Questions were answered to the patient's satisfaction.     Bjorn Pippin

## 2020-11-29 NOTE — Op Note (Signed)
Orthopaedic Surgery Operative Note (CSN: [id number])  Jorge Strong  19-Feb-1966 Date of Surgery: 11/29/2020   Diagnoses:  Rotator cuff tear right-sided  Procedure: Arthroscopic extensive debridement Arthroscopic subacromial decompression Arthroscopic rotator cuff repair Arthroscopic biceps tenodesis  Arthroscopic capsular repair   Operative Finding Exam under anesthesia: Full motion no limitation in forward flexion however there was some tension with external rotation. Articular space: No loose bodies, capsule intact, anterior labral fraying and significant injection of the capsule anteriorly with thickening of the [facility]. Chondral surfaces:Intact, no sign of chondral degeneration on the glenoid or humeral head Biceps: Significant redness and a type I SLAP tear in the setting of a massive tear we felt that a tenodesis was in his best interest to avoid interference with a superior capsular reconstruction. Subscapularis: Intact Superior Cuff: Massive irreparable tear of the supraspinatus that is clearly been chronic or acute on chronic. Bursal side: As above, unable to repair native tissue. This is almost appear to be like a type II tear with about a centimeter and a half of tendon still remaining on the tuberosity and a failure close to the muscular junction however this was clearly chronic. Superior capsular reconstruction was robust.  We had initially planned on doing a cuff repair however the patient's tissue was of extremely poor quality and this was an isolated supraspinatus tear. After attempts at mobilizing the tissue it was clear that we were not able to repair the tissue as it was and that he would be a better candidate for superior capsular reconstruction as his joint was still in reasonable condition. Were able to switch gears and perform a superior capsular reconstruction without issue. If the patient fails this and had symptoms we would consider a reverse shoulder  arthroplasty.  Post-operative plan: The patient will be non-weightbearing in a sling for 6 weeks.  The patient will be discharged home.  DVT prophylaxis not indicated in ambulatory upper extremity patient without known risk factors.   Pain control with PRN pain medication preferring oral medicines.  Follow up plan will be scheduled in approximately 7 days for incision check and XR.  Post-Op Diagnosis: Same Surgeons:Primary: Jorge Gash, MD Assistants:Caroline McBane PA-C Location: [facility] OR ROOM 6 Anesthesia: General with Exparel interscalene block Antibiotics: Ancef 2 g Tourniquet time: None Estimated Blood Loss: Minimal Complications: None Specimens: None Implants: Implant Name Type Inv. Item Serial No. Manufacturer Lot No. LRB No. Used Action  IMPLANT SPEEDBRIDGE KIT - [id number] Orthopedic Implant IMPLANT SPEEDBRIDGE KIT  [employer] [id number] Right 1 Implanted  ANCHOR SUT 1.8 FIBERTAK 2 SUT - [id number] Anchor ANCHOR SUT 1.8 FIBERTAK 2 SUT  [employer] [id number] Right 1 Implanted  ANCHOR SUT 1.8 FIBERTAK 2 SUT - [id number] Anchor ANCHOR SUT 1.8 FIBERTAK 2 SUT  [employer] [id number] Right 1 Implanted  ANCHOR SUT 1.8 FIBERTAK 2 SUT - [id number] Anchor ANCHOR SUT 1.8 FIBERTAK 2 SUT  [employer] [id number] Right 1 Implanted  ANCHOR SUT 1.8 FIBERTAK 2 SUT - [id number] Anchor ANCHOR SUT 1.8 FIBERTAK 2 SUT  [employer] [id number] Right 1 Implanted  ANCHOR SUT 1.8 FIBERTAK 2 SUT - [id number] Anchor ANCHOR SUT 1.8 FIBERTAK 2 SUT  [employer] [id number] Right 1 Implanted  TISSUE ARTHOFLEX THICK 3MM - [id number] Tissue TISSUE ARTHOFLEX THICK 3MM [id number] [facility] [id number] Right 1 Implanted    Indications for Surgery:   Jorge Strong is a 55 y.o. male with continued shoulder pain refractory to nonoperative measures for extended period of time.  Patient's story was that he had a fall a few months ago and had a new injury to the shoulder. This was thought to be a acute injury however  he had an atypical tear on MRI with the tendon still left on the bone. The risks and benefits were explained at length including but not limited to continued pain, cuff failure, biceps tenodesis failure, stiffness, need for further surgery and infection.   Procedure:   Patient was correctly identified in the preoperative holding area and operative site marked.  Patient brought to OR and positioned beachchair on an [place] table ensuring that all bony prominences were padded and the head was in an appropriate location.  Anesthesia was induced and the operative shoulder was prepped and draped in the usual sterile fashion.  Timeout was called preincision.  A standard posterior viewing portal was made after localizing the portal with a spinal needle.  An anterior accessory portal was also made.  After clearing the articular space the camera was positioned in the subacromial space.  Findings above.    Extensive debridement was performed of the anterior interval tissue, labral fraying and the bursa.  Subacromial decompression: We made a lateral portal with spinal needle guidance. We then proceeded to debride bursal tissue extensively with a shaver and arthrocare device. At that point we continued to identify the borders of the acromion and identify the spur. We then carefully preserved the deltoid fascia and used a burr to convert the acromion to a Type 1 flat acromion without issue.  Biceps tenodesis: We marked the tendon and then performed a tenotomy and debridement of the stump in the articular space. We then identified the biceps tendon in its groove suprapec with the arthroscope in the lateral portal taking care to move from lateral to medial to avoid injury to the subscapularis. At that point we unroofed the tendon itself and mobilized it. An accessory anterior portal was made in line with the tendon and we grasped it from the anterior superior portal and worked from the accessory anterior portal. Two  [place] 1.19m knotless anchors were placed in the groove and the tendon was secured in a luggage loop style fashion with a pass of the limb of suture through the tendon using a scorpion device to avoid pull-through.  Repair was completed with good tension on the tendon.  Residual stump of the tendon was removed after being resected with a RF ablator.   We spent a great deal of time trying to mobilize the native supraspinatus however it was clear that this was irreparable. At that point we felt that a partial repair was not in the patient's best interest and that we could successfully restore force couples of the joint with a superior capsular reconstruction.  Superior capsular reconstruction: Once we determined that the rotator cuff superiorly was irreparable and that the joint was amenable to it we decided to proceed with a superior capsular reconstruction.  We cleared the soft tissue and the remaining cuff from both the tuberosity as well as the superior aspect of the glenoid.  Bone was repaired for healing with cortical abrasion using a shaver.  At that point we were able to start using percutaneously placed portals to place our fiber tack anchors into the glenoid.  These were knotless anchors and each placed x3 without issue encompassing both the posterior and anterior extents of the cuffs origin over the glenoid as well as a central anchor.  We then placed 2 swivel lock anchors at  the medial border of the articular surface with tapes loaded from the speed bridge kit.  We placed the arm in neutral rotation and 20-30 degrees of abduction.At this point we used the Arthrex measuring device to measure the dimensions of the glenoid as well as the articular surface and took measurements to use to make the dermal graft.  We added 5 mm of with both to the anterior and posterior dimension of the graft as well as the medial aspect of the graft.  We added 10 to the lateral aspect.    We then prepared the graft and  prepared our holes in the graft for our suture placement.  Once this was complete we took great care using a cannula divider to pass each of the medial glenoid based anchors through the graft in typical fashion before placing our medial row of our tuberosity fixation through the graft.  We shuttled the graft into the joint pushing it in with a grasper.  We then used a probe as well as sequentially tightening medial anchors to anchor the graft to the bone of the glenoid.  This point we performed a speed bridge repair in typical fashion forming a box an X configuration of the lateral footprint over the cuff.  We were able to secure the lateral row with 2 4.75 mm swivel locks in typical fashion.  This point we had a good repair and good recreation of the superior capsule.  We then used a scorpion to perform side-to-side repairs of the subscapularis and the posterior cuff with 2 sutures in the posterior cuff and one in the subscapularis.  We had a great overall reconstruction and good tension.  The incisions were closed with absorbable monocryl and steri strips.  A sterile dressing was placed along with a sling. The patient was awoken from general anesthesia and taken to the PACU in stable condition without complication.   Noemi Chapel, PA-C, present and scrubbed throughout the case, critical for completion in a timely fashion, and for retraction, instrumentation, closure.

## 2020-11-29 NOTE — Progress Notes (Signed)
Assisted Dr. Singer with right, ultrasound guided, interscalene  block. Side rails up, monitors on throughout procedure. See vital signs in flow sheet. Tolerated Procedure well. 

## 2020-11-29 NOTE — Anesthesia Procedure Notes (Signed)
Anesthesia Regional Block: Interscalene brachial plexus block   Pre-Anesthetic Checklist: ,, timeout performed, Correct Patient, Correct Site, Correct Laterality, Correct Procedure, Correct Position, site marked, Risks and benefits discussed,  Surgical consent,  Pre-op evaluation,  At surgeon's request and post-op pain management  Laterality: Right  Prep: chloraprep       Needles:  Injection technique: Single-shot  Needle Type: Echogenic Stimulator Needle     Needle Length: 5cm  Needle Gauge: 22     Additional Needles:   Narrative:  Start time: 11/29/2020 6:57 AM End time: 11/29/2020 7:07 AM Injection made incrementally with aspirations every 5 mL.  Performed by: Personally  Anesthesiologist: Heather Roberts, MD  Additional Notes: Functioning IV was confirmed and monitors applied.  A 83mm 22ga echogenic arrow stimulator was used. Sterile prep and drape,hand hygiene and sterile gloves were used.Ultrasound guidance: relevant anatomy identified, needle position confirmed, local anesthetic spread visualized around nerve(s)., vascular puncture avoided.  Image printed for medical record.  Negative aspiration and negative test dose prior to incremental administration of local anesthetic. The patient tolerated the procedure well.

## 2020-11-29 NOTE — Discharge Instructions (Signed)
Next dose of Tylenol can be given after 1:00PM. Next dose of NSAID (Ibuprofen, Aleve, Motrin) can be given after 1:00PM.    Information for Discharge Teaching: EXPAREL (bupivacaine liposome injectable suspension)   Your surgeon or anesthesiologist gave you EXPAREL(bupivacaine) to help control your pain after surgery.   EXPAREL is a local anesthetic that provides pain relief by numbing the tissue around the surgical site.  EXPAREL is designed to release pain medication over time and can control pain for up to 72 hours.  Depending on how you respond to EXPAREL, you may require less pain medication during your recovery.  Possible side effects:  Temporary loss of sensation or ability to move in the area where bupivacaine was injected.  Nausea, vomiting, constipation  Rarely, numbness and tingling in your mouth or lips, lightheadedness, or anxiety may occur.  Call your doctor right away if you think you may be experiencing any of these sensations, or if you have other questions regarding possible side effects.  Follow all other discharge instructions given to you by your surgeon or nurse. Eat a healthy diet and drink plenty of water or other fluids.  If you return to the hospital for any reason within 96 hours following the administration of EXPAREL, it is important for health care providers to know that you have received this anesthetic. A teal colored band has been placed on your arm with the date, time and amount of EXPAREL you have received in order to alert and inform your health care providers. Please leave this armband in place for the full 96 hours following administration, and then you may remove the band.    Regional Anesthesia Blocks  1. Numbness or the inability to move the "blocked" extremity may last from 3-48 hours after placement. The length of time depends on the medication injected and your individual response to the medication. If the numbness is not going away after  48 hours, call your surgeon.  2. The extremity that is blocked will need to be protected until the numbness is gone and the  Strength has returned. Because you cannot feel it, you will need to take extra care to avoid injury. Because it may be weak, you may have difficulty moving it or using it. You may not know what position it is in without looking at it while the block is in effect.  3. For blocks in the legs and feet, returning to weight bearing and walking needs to be done carefully. You will need to wait until the numbness is entirely gone and the strength has returned. You should be able to move your leg and foot normally before you try and bear weight or walk. You will need someone to be with you when you first try to ensure you do not fall and possibly risk injury.  4. Bruising and tenderness at the needle site are common side effects and will resolve in a few days.  5. Persistent numbness or new problems with movement should be communicated to the surgeon or the [facility] [phone] [facility] [phone]).     Post Anesthesia Home Care Instructions  Activity: Get plenty of rest for the remainder of the day. A responsible individual must stay with you for 24 hours following the procedure.  For the next 24 hours, DO NOT: -Drive a car -Advertising copywriter -Drink alcoholic beverages -Take any medication unless instructed by your physician -Make any legal decisions or sign important papers.  Meals: Start with liquid  foods such as gelatin or soup. Progress to regular foods as tolerated. Avoid greasy, spicy, heavy foods. If nausea and/or vomiting occur, drink only clear liquids until the nausea and/or vomiting subsides. Call your physician if vomiting continues.  Special Instructions/Symptoms: Your throat may feel dry or sore from the anesthesia or the breathing tube placed in your throat during surgery. If this causes discomfort, gargle with warm salt  water. The discomfort should disappear within 24 hours.  If you had a scopolamine patch placed behind your ear for the management of post- operative nausea and/or vomiting:  1. The medication in the patch is effective for 72 hours, after which it should be removed.  Wrap patch in a tissue and discard in the trash. Wash hands thoroughly with soap and water. 2. You may remove the patch earlier than 72 hours if you experience unpleasant side effects which may include dry mouth, dizziness or visual disturbances. 3. Avoid touching the patch. Wash your hands with soap and water after contact with the patch.         Ramond Marrow MD, MPH Alfonse Alpers, PA-C [facility] [address], [address] [phone] (tel)   [phone] (fax)   POST-OPERATIVE INSTRUCTIONS - SHOULDER ARTHROSCOPY  WOUND CARE  You may remove the Operative Dressing on Post-Op Day #3 (72hrs after surgery).    Alternatively if you would like you can leave dressing on until follow-up if within 7-8 days but keep it dry.  Leave steri-strips in place until they fall off on their own, usually 2 weeks postop.  There may be a small amount of fluid/bleeding leaking at the surgical site.  o This is normal; the shoulder is filled with fluid during the procedure and can leak for 24-48hrs after surgery.   You may change/reinforce the bandage as needed.   Use the Cryocuff or Ice as often as possible for the first 7 days, then as needed for pain relief. Always keep a towel, ACE wrap or other barrier between the cooling unit and your skin.   You may shower on Post-Op Day #3. Gently pat the area dry. Do not soak the shoulder in water or submerge it. Keep dry incisions as dry as possible.  Do not go swimming in the pool or ocean until 4 weeks after surgery or when otherwise instructed.    EXERCISES/BRACING ? Sling should be used at all times until follow-up.  ? You can remove sling for hygiene.     Please  continue to ambulate and do not stay sitting or lying for too long. Perform foot and wrist pumps to assist in circulation.  POST-OP MEDICATIONS- Multimodal approach to pain control  In general your pain will be controlled with a combination of substances.  Prescriptions unless otherwise discussed are electronically sent to your pharmacy.  This is a carefully made plan we use to minimize narcotic use.     ? Celebrex - Anti-inflammatory medication taken on a scheduled basis ? Acetaminophen - Non-narcotic pain medicine taken on a scheduled basis  ? Gabapentin - this is a medication to help with pain, take on scheduled basis ? Oxycodone - This is a strong narcotic, to be used only on an as needed basis for pain. ? Zofran - take as needed for nausea  FOLLOW-UP  If you develop a Fever (?101.5), Redness or Drainage from the surgical incision site, please call our office to arrange for an evaluation.  Please call the office to schedule a follow-up appointment  for your suture removal, 10-14 days post-operatively.    HELPFUL INFORMATION   If you had a block, it will wear off between 8-24 hrs postop typically.  This is period when your pain may go from nearly zero to the pain you would have had postop without the block.  This is an abrupt transition but nothing dangerous is happening.  You may take an extra dose of narcotic when this happens.   You may be more comfortable sleeping in a semi-seated position the first few nights following surgery.  Keep a pillow propped under the elbow and forearm for comfort.  If you have a recliner type of chair it might be beneficial.  If not that is fine too, but it would be helpful to sleep propped up with pillows behind your operated shoulder as well under your elbow and forearm.  This will reduce pulling on the suture lines.   When dressing, put your operative arm in the sleeve first.  When getting undressed, take your operative arm out last.  Loose fitting,  button-down shirts are recommended.  Often in the first days after surgery you may be more comfortable keeping your operative arm under your shirt and not through the sleeve.   You may return to work/school in the next couple of days when you feel up to it.  Desk work and typing in the sling is fine.   We suggest you use the pain medication the first night prior to going to bed, in order to ease any pain when the anesthesia wears off. You should avoid taking pain medications on an empty stomach as it will make you nauseous.   You should wean off your narcotic medicines as soon as you are able.  Most patients will be off or using minimal narcotics before their first postop appointment.    Do not drink alcoholic beverages or take illicit drugs when taking pain medications.   It is against the law to drive while taking narcotics.  In some states it is against the law to drive while your arm is in a sling.    Pain medication may make you constipated.  Below are a few solutions to try in this order: - Decrease the amount of pain medication if you aren't having pain. - Drink lots of decaffeinated fluids. - Drink prune juice and/or eat dried prunes   If the first 3 don't work start with additional solutions - Take Colace - an over-the-counter stool softener - Take Senokot - an over-the-counter laxative - Take Miralax - a stronger over-the-counter laxative  For more information including helpful videos and documents visit our website:   [email or web address]

## 2020-11-30 ENCOUNTER — Encounter (HOSPITAL_BASED_OUTPATIENT_CLINIC_OR_DEPARTMENT_OTHER): Payer: Self-pay | Admitting: Orthopaedic Surgery

## 2021-01-02 MED FILL — MOMETASONE FUROATE 50 MCG S: 50 | 30 days supply | Qty: 17 | Fill #2

## 2021-01-14 ENCOUNTER — Encounter: Payer: Self-pay | Admitting: Physical Therapy

## 2021-01-14 ENCOUNTER — Ambulatory Visit: Payer: No Typology Code available for payment source | Attending: Orthopedic Surgery | Admitting: Physical Therapy

## 2021-01-14 ENCOUNTER — Other Ambulatory Visit: Payer: Self-pay

## 2021-01-14 DIAGNOSIS — M6281 Muscle weakness (generalized): Secondary | ICD-10-CM | POA: Insufficient documentation

## 2021-01-14 DIAGNOSIS — M25511 Pain in right shoulder: Secondary | ICD-10-CM | POA: Diagnosis not present

## 2021-01-14 DIAGNOSIS — R600 Localized edema: Secondary | ICD-10-CM | POA: Insufficient documentation

## 2021-01-14 DIAGNOSIS — M25611 Stiffness of right shoulder, not elsewhere classified: Secondary | ICD-10-CM | POA: Insufficient documentation

## 2021-01-14 MED FILL — TAMSULOSIN HCL 0.4 MG CAP: 0.4 | 90 days supply | Qty: 180 | Fill #2

## 2021-01-14 NOTE — Therapy (Signed)
[facility] [address] [place], [place], [address] Phone: [phone]   Fax:  [phone]  Physical Therapy Evaluation  Patient Details  Name: Jorge Strong MRN: [id number] Date of Birth: 1966/06/13 Referring Provider (PT): Ilean China Date: 01/14/2021   PT End of Session - 01/14/21 1740    Visit Number 1    Date for PT Re-Evaluation 03/16/21    PT Start Time 1650    PT Stop Time 1730    PT Time Calculation (min) 40 min    Activity Tolerance Patient tolerated treatment well;Patient limited by pain    Behavior During Therapy [facility] for tasks assessed/performed           Past Medical History:  Diagnosis Date  . Closed rib fracture    past hx, recent 3 days ago fell left side"? a new left rib fracture"-has not been confirmed.  . Enlarged prostate   . GERD (gastroesophageal reflux disease)   . H/O seasonal allergies   . Sleep apnea    hx of sleep apnea, no longer an issue after weight loss  . Wound of left leg    left leg wound is healing, but remains draining clear serous-covering over wound.    Past Surgical History:  Procedure Laterality Date  . APPENDECTOMY     age 66  . BICEPT TENODESIS Right 11/29/2020   Procedure: BICEPS TENODESIS;  Surgeon: Bjorn Pippin, MD;  Location: [facility];  Service: Orthopedics;  Laterality: Right;  . COLONOSCOPY WITH PROPOFOL N/A 02/05/2017   Procedure: COLONOSCOPY WITH PROPOFOL;  Surgeon: Sherrilyn Rist, MD;  Location: [facility];  Service: Gastroenterology;  Laterality: N/A;  . INGUINAL HERNIA REPAIR Right 11/02/2015   Procedure: LAPAROSCOPIC RIGHT INGUINAL HERNIA REPAIR WITH MESH;  Surgeon: Axel Filler, MD;  Location: [facility];  Service: General;  Laterality: Right;  . INSERTION OF MESH Right 11/02/2015   Procedure: INSERTION OF MESH;  Surgeon: Axel Filler, MD;  Location: [facility];  Service: General;  Laterality: Right;  . LAPAROSCOPIC GASTRIC SLEEVE  RESECTION N/A 02/09/2017   Procedure: LAPAROSCOPIC GASTRIC SLEEVE RESECTION, UPPER ENDO;  Surgeon: De Blanch Kinsinger, MD;  Location: [facility];  Service: General;  Laterality: N/A;  . SHOULDER ARTHROSCOPY WITH ROTATOR CUFF REPAIR AND SUBACROMIAL DECOMPRESSION Right 11/29/2020   Procedure: SHOULDER ARTHROSCOPY DEBRIDEMENT  WITH SUPERIOR CAPSULAR RECONSTRUCION  ROTATOR CUFF REPAIR AND SUBACROMIAL DECOMPRESSION;  Surgeon: Bjorn Pippin, MD;  Location: [facility];  Service: Orthopedics;  Laterality: Right;  . UPPER GI ENDOSCOPY  02/09/2017   Procedure: UPPER GI ENDOSCOPY;  Surgeon: De Blanch Kinsinger, MD;  Location: [facility];  Service: General;;    There were no vitals filed for this visit.    Subjective Assessment - 01/14/21 1651    Subjective Pt reports to clinic following R shoulder arthroscopy with superior capsule repair and biceps tenodesis. Pt was NWB and in sling until this past Thursday 01/10/21. Pt reports pain has been minimal with mild aching and occasional sharp pain with movement. Pt denies N/T. States he was icing regularly 2-3x per day but has not been icing recently. Pt works as a Water engineer for [employer] and states some mild aching/pain with typing. Pt very active as PLOF with backpacking and playing disc golf; would like to return to these activities.    Limitations Lifting;House hold activities    Diagnostic tests MRI R shoulder    Patient Stated Goals get back to  PLOF    Currently in Pain? Yes    Pain Score 2     Pain Location Shoulder    Pain Orientation Right    Pain Descriptors / Indicators Aching    Pain Type Surgical pain    Pain Onset More than a month ago    Pain Frequency Intermittent    Aggravating Factors  reaching overhead, lifting    Pain Relieving Factors ice, pain meds              [facility] PT Assessment - 01/14/21 0001      Assessment   Medical Diagnosis R shoulder arthroscopy with capsule repair and biceps tenodesis    Referring Provider  (PT) Eulah PontMurphy    Onset Date/Surgical Date 11/29/20    Hand Dominance Right    Next MD Visit 02/21/21      Precautions   Precautions Shoulder    Type of Shoulder Precautions follow protocol per Dr Everardo PacificVarkey; in media attachments and in folder      Restrictions   Weight Bearing Restrictions No      Balance Screen   Has the patient fallen in the past 6 months Yes    How many times? 1   fall that caused shoulder injury   Has the patient had a decrease in activity level because of a fear of falling?  No    Is the patient reluctant to leave their home because of a fear of falling?  No      Home Environment   Additional Comments some housework, yardwork      Prior Function   Level of Independence Independent    Vocation Full time employment    Estate agentVocation Requirements preservice registration at [employer]    Leisure disc golf, backpacking, walking dogs      Sensation   Light Touch Appears Intact      Posture/Postural Control   Posture/Postural Control Postural limitations    Postural Limitations Rounded Shoulders;Forward head      ROM / Strength   AROM / PROM / Strength PROM      PROM   PROM Assessment Site Shoulder    Right/Left Shoulder Right    Right Shoulder Flexion 100 Degrees    Right Shoulder ABduction 60 Degrees    Right Shoulder Internal Rotation 50 Degrees    Right Shoulder External Rotation 35 Degrees      Palpation   Palpation comment tender to palpation R ant shoulder, R biceps, and R triceps                      Objective measurements completed on examination: See above findings.       [facility]/Exercise - 01/14/21 0001      Exercises   Exercises Shoulder      Shoulder Exercises: Supine   Flexion AAROM;Right;5 reps    ABduction AAROM;Right;5 reps      Shoulder Exercises: ROM/Strengthening   Pendulum x5      Shoulder Exercises: Stretch   Table Stretch - Flexion 5 reps    Table Stretch - Abduction 5 reps                   PT Education - 01/14/21 1740    Education Details Pt educated on POC and HEP    Person(s) Educated Patient    Methods Explanation;Demonstration;Handout    Comprehension Verbalized understanding;Returned demonstration            PT Short  Term Goals - 01/14/21 1756      PT SHORT TERM GOAL #1   Title Pt will be I with initial HEP    Time 2    Period Weeks    Status New    Target Date 01/28/21             PT Long Term Goals - 01/14/21 1756      PT LONG TERM GOAL #1   Title Pt will demo R shoulder flexion/abduction WFL with no reports of increased R shoulder pain    Time 12    Period Weeks    Status New    Target Date 04/08/21      PT LONG TERM GOAL #2   Title Pt will demo R shoulder MMT equivalent to L shoulder    Time 12    Period Weeks    Status New    Target Date 04/08/21      PT LONG TERM GOAL #3   Title Pt will demo functional R shoulder IR/ER WNL    Time 12    Period Weeks    Status New    Target Date 04/08/21      PT LONG TERM GOAL #4   Title Pt will report 50% reduction in R shoulder pain with activity    Time 12    Period Weeks    Status New    Target Date 04/08/21                  Plan - 01/14/21 1746    Clinical Impression Statement Pt presents to clinic s/p R shoulder arthroscopy 11/29/20 with superior capsule repair and biceps tenodesis. Released from sling 3/3; had no passive ROM for first 6 weeks d/t use of donor tissue in repair per MD. Follow Dr. Austin Miles protocol; may begin light passive stretching at end ranges, begin AAROM progressing supine to vertical, and gradually progress to AROM. RC isometrics beginning at 8 weeks. See media or folder in clinic for specific protocol. Pt demos significant guarding/tightness esp with abduction/ER; frequent cues for relaxation. Pt very active as PLOF playing disc golf and backpacking, would like to return to that level of function.    Examination-Participation Restrictions  Community Activity;Interpersonal Relationship    Stability/Clinical Decision Making Stable/Uncomplicated    Clinical Decision Making Low    Rehab Potential Good    PT Frequency 2x / week    PT Duration 12 weeks    PT Treatment/Interventions ADLs/Self Care Home Management;Electrical Stimulation;Cryotherapy;Neuromuscular re-education;Therapeutic exercise;Therapeutic activities;Patient/family education;Manual techniques;Passive range of motion;Vasopneumatic Device    PT Next Visit Plan initiate PROM, AAROM supine progressing to vertical, scap stab with arm below shoulder level, <2-5 lbs operative arm    PT Home Exercise Plan see pt instructions    Consulted and Agree with Plan of Care Patient           Patient will benefit from skilled therapeutic intervention in order to improve the following deficits and impairments:  Decreased range of motion,Increased muscle spasms,Impaired UE functional use,Pain,Hypomobility,Decreased strength,Increased edema  Visit Diagnosis: Acute pain of right shoulder  Stiffness of right shoulder, not elsewhere classified  Localized edema  Muscle weakness (generalized)     Problem List Patient Active Problem List   Diagnosis Date Noted  . Obesity 02/09/2017  . Screening for colon cancer   . Preoperative clearance 01/21/2017  . Obstructive sleep apnea 08/13/2009  . DYSPNEA 08/13/2009  . ALLERGY 08/13/2009   Lysle Rubens, PT, DPT Maryanna Shape  Sugg 01/14/2021, 5:58 PM  [facility] [address] [place], [place], [address] Phone: [phone]   Fax:  [phone]  Name: Jorge Strong MRN: [id number] Date of Birth: 09/15/1966

## 2021-01-14 NOTE — Patient Instructions (Signed)
Access Code: [email or web address] URL: [email or web address] Date: 01/14/2021 Prepared by: Lysle Rubens  Exercises Supine Shoulder Flexion with Dowel - 1 x daily - 7 x weekly - 3 sets - 10 reps Supine Shoulder Abduction AAROM with Dowel - 1 x daily - 7 x weekly - 3 sets - 10 reps Seated Shoulder Flexion Towel Slide at Table Top - 1 x daily - 7 x weekly - 3 sets - 10 reps - 3-5 sec hold Seated Shoulder Abduction Towel Slide at Table Top - 1 x daily - 7 x weekly - 3 sets - 10 reps - 3-5 sec hold Circular Shoulder Pendulum with Table Support - 1 x daily - 7 x weekly - 3 sets - 10 reps

## 2021-01-16 ENCOUNTER — Ambulatory Visit: Payer: No Typology Code available for payment source | Admitting: Physical Therapy

## 2021-01-22 ENCOUNTER — Other Ambulatory Visit: Payer: Self-pay

## 2021-01-22 ENCOUNTER — Encounter: Payer: Self-pay | Admitting: Physical Therapy

## 2021-01-22 ENCOUNTER — Ambulatory Visit: Payer: No Typology Code available for payment source | Admitting: Physical Therapy

## 2021-01-22 DIAGNOSIS — M6281 Muscle weakness (generalized): Secondary | ICD-10-CM

## 2021-01-22 DIAGNOSIS — M25511 Pain in right shoulder: Secondary | ICD-10-CM | POA: Diagnosis not present

## 2021-01-22 DIAGNOSIS — R600 Localized edema: Secondary | ICD-10-CM

## 2021-01-22 DIAGNOSIS — M25611 Stiffness of right shoulder, not elsewhere classified: Secondary | ICD-10-CM

## 2021-01-22 NOTE — Therapy (Signed)
[facility] [address] [place], [place], [address] Phone: [phone]   Fax:  [phone]  Physical Therapy Treatment  Patient Details  Name: Jorge Strong MRN: [id number] Date of Birth: Apr 14, 1966 Referring Provider (PT): Ilean China Date: 01/22/2021   PT End of Session - 01/22/21 1648    Visit Number 2    Date for PT Re-Evaluation 03/16/21    PT Start Time 1605    PT Stop Time 1645    PT Time Calculation (min) 40 min    Activity Tolerance Patient tolerated treatment well;Patient limited by pain    Behavior During Therapy [facility] for tasks assessed/performed           Past Medical History:  Diagnosis Date  . Closed rib fracture    past hx, recent 3 days ago fell left side"? a new left rib fracture"-has not been confirmed.  . Enlarged prostate   . GERD (gastroesophageal reflux disease)   . H/O seasonal allergies   . Sleep apnea    hx of sleep apnea, no longer an issue after weight loss  . Wound of left leg    left leg wound is healing, but remains draining clear serous-covering over wound.    Past Surgical History:  Procedure Laterality Date  . APPENDECTOMY     age 55  . BICEPT TENODESIS Right 11/29/2020   Procedure: BICEPS TENODESIS;  Surgeon: Bjorn Pippin, MD;  Location: [facility];  Service: Orthopedics;  Laterality: Right;  . COLONOSCOPY WITH PROPOFOL N/A 02/05/2017   Procedure: COLONOSCOPY WITH PROPOFOL;  Surgeon: Sherrilyn Rist, MD;  [facility];  Service: Gastroenterology;  Laterality: N/A;  . INGUINAL HERNIA REPAIR Right 11/02/2015   Procedure: LAPAROSCOPIC RIGHT INGUINAL HERNIA REPAIR WITH MESH;  Surgeon: Axel Filler, MD;  Location: [facility];  Service: General;  Laterality: Right;  . INSERTION OF MESH Right 11/02/2015   Procedure: INSERTION OF MESH;  Surgeon: Axel Filler, MD;  [facility];  Service: General;  Laterality: Right;  . LAPAROSCOPIC GASTRIC SLEEVE  RESECTION N/A 02/09/2017   Procedure: LAPAROSCOPIC GASTRIC SLEEVE RESECTION, UPPER ENDO;  Surgeon: De Blanch Kinsinger, MD;  [facility];  Service: General;  Laterality: N/A;  . SHOULDER ARTHROSCOPY WITH ROTATOR CUFF REPAIR AND SUBACROMIAL DECOMPRESSION Right 11/29/2020   Procedure: SHOULDER ARTHROSCOPY DEBRIDEMENT  WITH SUPERIOR CAPSULAR RECONSTRUCION  ROTATOR CUFF REPAIR AND SUBACROMIAL DECOMPRESSION;  Surgeon: Bjorn Pippin, MD;  Location: [facility];  Service: Orthopedics;  Laterality: Right;  . UPPER GI ENDOSCOPY  02/09/2017   Procedure: UPPER GI ENDOSCOPY;  Surgeon: De Blanch Kinsinger, MD;  [facility];  Service: General;;    There were no vitals filed for this visit.   Subjective Assessment - 01/22/21 1607    Subjective Doing pretty good    Currently in Pain? Yes    Pain Score 2     Pain Location Shoulder    Pain Orientation Right                             [facility] Adult PT Treatment/Exercise - 01/22/21 0001      Shoulder Exercises: Standing   Other Standing Exercises Ball on table flex, CW/CCW x15 each    Other Standing Exercises AAROM flex, Ext, IR up back 2x10      Shoulder Exercises: Isometric Strengthening   Flexion Supine;5X5"    Extension 5X5";Supine  External Rotation Supine;5X5"    Internal Rotation 5X5";Supine      Manual Therapy   Manual Therapy Passive ROM;Joint mobilization    Joint Mobilization R GH grades 1    Passive ROM R shoulder in all directons.                    PT Short Term Goals - 01/14/21 1756      PT SHORT TERM GOAL #1   Title Pt will be I with initial HEP    Time 2    Period Weeks    Status New    Target Date 01/28/21             PT Long Term Goals - 01/14/21 1756      PT LONG TERM GOAL #1   Title Pt will demo R shoulder flexion/abduction WFL with no reports of increased R shoulder pain    Time 12    Period Weeks    Status New    Target Date 04/08/21      PT LONG TERM  GOAL #2   Title Pt will demo R shoulder MMT equivalent to L shoulder    Time 12    Period Weeks    Status New    Target Date 04/08/21      PT LONG TERM GOAL #3   Title Pt will demo functional R shoulder IR/ER WNL    Time 12    Period Weeks    Status New    Target Date 04/08/21      PT LONG TERM GOAL #4   Title Pt will report 50% reduction in R shoulder pain with activity    Time 12    Period Weeks    Status New    Target Date 04/08/21                 Plan - 01/22/21 1651    Clinical Impression Statement Pt did well overall and gave good effort. RUE ROM limitation noted throughout session. No issues with isometrics, flexion was weakness of all movements. Some pain at the end range of PROM, constant cues needed to relax with MT>    Examination-Participation Restrictions Community Activity;Interpersonal Relationship    Stability/Clinical Decision Making Stable/Uncomplicated    Rehab Potential Good    PT Frequency 2x / week    PT Duration 12 weeks    PT Treatment/Interventions ADLs/Self Care Home Management;Electrical Stimulation;Cryotherapy;Neuromuscular re-education;Therapeutic exercise;Therapeutic activities;Patient/family education;Manual techniques;Passive range of motion;Vasopneumatic Device    PT Next Visit Plan initiate PROM, AAROM supine progressing to vertical, scap stab with arm below shoulder level, <2-5 lbs operative arm           Patient will benefit from skilled therapeutic intervention in order to improve the following deficits and impairments:  Decreased range of motion,Increased muscle spasms,Impaired UE functional use,Pain,Hypomobility,Decreased strength,Increased edema  Visit Diagnosis: Acute pain of right shoulder  Stiffness of right shoulder, not elsewhere classified  Localized edema  Muscle weakness (generalized)     Problem List Patient Active Problem List   Diagnosis Date Noted  . Obesity 02/09/2017  . Screening for colon cancer   .  Preoperative clearance 01/21/2017  . Obstructive sleep apnea 08/13/2009  . DYSPNEA 08/13/2009  . ALLERGY 08/13/2009    [name], PTA 01/22/2021, 4:56 PM  [facility] [address] [place], [place], [address] Phone: [phone]   Fax:  [phone]  Name: Jorge Strong MRN:  [id number] Date of Birth: Oct 28, 1966

## 2021-01-24 ENCOUNTER — Ambulatory Visit: Payer: No Typology Code available for payment source | Admitting: Physical Therapy

## 2021-01-24 ENCOUNTER — Other Ambulatory Visit: Payer: Self-pay

## 2021-01-24 ENCOUNTER — Encounter: Payer: Self-pay | Admitting: Physical Therapy

## 2021-01-24 DIAGNOSIS — M25511 Pain in right shoulder: Secondary | ICD-10-CM

## 2021-01-24 DIAGNOSIS — M25611 Stiffness of right shoulder, not elsewhere classified: Secondary | ICD-10-CM

## 2021-01-24 DIAGNOSIS — M6281 Muscle weakness (generalized): Secondary | ICD-10-CM

## 2021-01-24 DIAGNOSIS — R600 Localized edema: Secondary | ICD-10-CM

## 2021-01-24 NOTE — Therapy (Signed)
[facility] [address] [place], [place], [address] Phone: [phone]   Fax:  [phone]  Physical Therapy Treatment  Patient Details  Name: Jorge Strong MRN: [id number] Date of Birth: 03/10/1966 Referring Provider (PT): Ilean China Date: 01/24/2021   PT End of Session - 01/24/21 1335    Visit Number 3    Date for PT Re-Evaluation 03/16/21    PT Start Time 1300    PT Stop Time 1332    PT Time Calculation (min) 32 min    Activity Tolerance Patient tolerated treatment well;Patient limited by pain    Behavior During Therapy [facility] for tasks assessed/performed           Past Medical History:  Diagnosis Date  . Closed rib fracture    past hx, recent 3 days ago fell left side"? a new left rib fracture"-has not been confirmed.  . Enlarged prostate   . GERD (gastroesophageal reflux disease)   . H/O seasonal allergies   . Sleep apnea    hx of sleep apnea, no longer an issue after weight loss  . Wound of left leg    left leg wound is healing, but remains draining clear serous-covering over wound.    Past Surgical History:  Procedure Laterality Date  . APPENDECTOMY     age 39  . BICEPT TENODESIS Right 11/29/2020   Procedure: BICEPS TENODESIS;  Surgeon: Bjorn Pippin, MD;  Location: [facility];  Service: Orthopedics;  Laterality: Right;  . COLONOSCOPY WITH PROPOFOL N/A 02/05/2017   Procedure: COLONOSCOPY WITH PROPOFOL;  Surgeon: Sherrilyn Rist, MD;  [facility];  Service: Gastroenterology;  Laterality: N/A;  . INGUINAL HERNIA REPAIR Right 11/02/2015   Procedure: LAPAROSCOPIC RIGHT INGUINAL HERNIA REPAIR WITH MESH;  Surgeon: Axel Filler, MD;  [facility];  Service: General;  Laterality: Right;  . INSERTION OF MESH Right 11/02/2015   Procedure: INSERTION OF MESH;  Surgeon: Axel Filler, MD;  [facility];  Service: General;  Laterality: Right;  . LAPAROSCOPIC GASTRIC SLEEVE  RESECTION N/A 02/09/2017   Procedure: LAPAROSCOPIC GASTRIC SLEEVE RESECTION, UPPER ENDO;  Surgeon: De Blanch Kinsinger, MD;  [facility];  Service: General;  Laterality: N/A;  . SHOULDER ARTHROSCOPY WITH ROTATOR CUFF REPAIR AND SUBACROMIAL DECOMPRESSION Right 11/29/2020   Procedure: SHOULDER ARTHROSCOPY DEBRIDEMENT  WITH SUPERIOR CAPSULAR RECONSTRUCION  ROTATOR CUFF REPAIR AND SUBACROMIAL DECOMPRESSION;  Surgeon: Bjorn Pippin, MD;  Location: [facility];  Service: Orthopedics;  Laterality: Right;  . UPPER GI ENDOSCOPY  02/09/2017   Procedure: UPPER GI ENDOSCOPY;  Surgeon: De Blanch Kinsinger, MD;  [facility];  Service: General;;    There were no vitals filed for this visit.   Subjective Assessment - 01/24/21 1302    Subjective Doing good, been a little sore, been pushing the limits on my stretches.    Currently in Pain? Yes    Pain Score 2     Pain Orientation Right                             [facility] Adult PT Treatment/Exercise - 01/24/21 0001      Shoulder Exercises: Standing   Other Standing Exercises Ball on table flex, CW/CCW x15 each; Tricep Ext red 2x15    Other Standing Exercises AAROM flex, Ext, IR up back 2x10; RLE ABD  AAROM 2x10      Shoulder  Exercises: Isometric Strengthening   Flexion Supine;5X5"    Extension 5X5";Supine    External Rotation Supine;5X5"    Internal Rotation 5X5";Supine      Manual Therapy   Manual Therapy Passive ROM;Joint mobilization    Joint Mobilization R GH grades 1    Passive ROM R shoulder in all directons.                    PT Short Term Goals - 01/24/21 1335      PT SHORT TERM GOAL #1   Title Pt will be I with initial HEP    Status Achieved             PT Long Term Goals - 01/14/21 1756      PT LONG TERM GOAL #1   Title Pt will demo R shoulder flexion/abduction WFL with no reports of increased R shoulder pain    Time 12    Period Weeks    Status New    Target Date 04/08/21       PT LONG TERM GOAL #2   Title Pt will demo R shoulder MMT equivalent to L shoulder    Time 12    Period Weeks    Status New    Target Date 04/08/21      PT LONG TERM GOAL #3   Title Pt will demo functional R shoulder IR/ER WNL    Time 12    Period Weeks    Status New    Target Date 04/08/21      PT LONG TERM GOAL #4   Title Pt will report 50% reduction in R shoulder pain with activity    Time 12    Period Weeks    Status New    Target Date 04/08/21                 Plan - 01/24/21 1335    Clinical Impression Statement Pt is 8 weeks post surgery, continued with AAROM and isometrics interventions. Added triceps ext without issues. Some pain reported at end range of PROM, again cues needed to relax and not resist PROM.    Examination-Participation Restrictions Community Activity;Interpersonal Relationship    Stability/Clinical Decision Making Stable/Uncomplicated    Rehab Potential Good    PT Frequency 2x / week    PT Duration 12 weeks    PT Treatment/Interventions ADLs/Self Care Home Management;Electrical Stimulation;Cryotherapy;Neuromuscular re-education;Therapeutic exercise;Therapeutic activities;Patient/family education;Manual techniques;Passive range of motion;Vasopneumatic Device    PT Next Visit Plan initiate PROM, AAROM supine progressing to vertical, scap stab with arm below shoulder level, <2-5 lbs operative arm           Patient will benefit from skilled therapeutic intervention in order to improve the following deficits and impairments:  Decreased range of motion,Increased muscle spasms,Impaired UE functional use,Pain,Hypomobility,Decreased strength,Increased edema  Visit Diagnosis: Localized edema  Muscle weakness (generalized)  Stiffness of right shoulder, not elsewhere classified  Acute pain of right shoulder     Problem List Patient Active Problem List   Diagnosis Date Noted  . Obesity 02/09/2017  . Screening for colon cancer   .  Preoperative clearance 01/21/2017  . Obstructive sleep apnea 08/13/2009  . DYSPNEA 08/13/2009  . ALLERGY 08/13/2009    [name], PTA 01/24/2021, 1:39 PM  [facility] [address] [place], [place], [address] Phone: [phone]   Fax:  [phone]  Name: Jorge Strong MRN: [id number] Date of Birth: 25-Aug-1966

## 2021-01-28 ENCOUNTER — Ambulatory Visit: Payer: No Typology Code available for payment source | Admitting: Physical Therapy

## 2021-01-28 ENCOUNTER — Other Ambulatory Visit: Payer: Self-pay

## 2021-01-28 ENCOUNTER — Encounter: Payer: Self-pay | Admitting: Physical Therapy

## 2021-01-28 DIAGNOSIS — M25511 Pain in right shoulder: Secondary | ICD-10-CM

## 2021-01-28 DIAGNOSIS — M6281 Muscle weakness (generalized): Secondary | ICD-10-CM

## 2021-01-28 DIAGNOSIS — M25611 Stiffness of right shoulder, not elsewhere classified: Secondary | ICD-10-CM

## 2021-01-28 DIAGNOSIS — R600 Localized edema: Secondary | ICD-10-CM

## 2021-01-28 NOTE — Therapy (Signed)
[facility] [address] [place], [place], [address] Phone: [phone]   Fax:  [phone]  Physical Therapy Treatment  Patient Details  Name: Jorge Strong MRN: [id number] Date of Birth: December 19, 1965 Referring Provider (PT): Ilean China Date: 01/28/2021   PT End of Session - 01/28/21 1340    Visit Number 4    Date for PT Re-Evaluation 03/16/21    PT Start Time 1300    PT Stop Time 1340    PT Time Calculation (min) 40 min    Activity Tolerance Patient tolerated treatment well;Patient limited by pain    Behavior During Therapy [facility] for tasks assessed/performed           Past Medical History:  Diagnosis Date  . Closed rib fracture    past hx, recent 3 days ago fell left side"? a new left rib fracture"-has not been confirmed.  . Enlarged prostate   . GERD (gastroesophageal reflux disease)   . H/O seasonal allergies   . Sleep apnea    hx of sleep apnea, no longer an issue after weight loss  . Wound of left leg    left leg wound is healing, but remains draining clear serous-covering over wound.    Past Surgical History:  Procedure Laterality Date  . APPENDECTOMY     age 67  . BICEPT TENODESIS Right 11/29/2020   Procedure: BICEPS TENODESIS;  Surgeon: Bjorn Pippin, MD;  Location: [facility];  Service: Orthopedics;  Laterality: Right;  . COLONOSCOPY WITH PROPOFOL N/A 02/05/2017   Procedure: COLONOSCOPY WITH PROPOFOL;  Surgeon: Sherrilyn Rist, MD;  [facility];  Service: Gastroenterology;  Laterality: N/A;  . INGUINAL HERNIA REPAIR Right 11/02/2015   Procedure: LAPAROSCOPIC RIGHT INGUINAL HERNIA REPAIR WITH MESH;  Surgeon: Axel Filler, MD;  [facility];  Service: General;  Laterality: Right;  . INSERTION OF MESH Right 11/02/2015   Procedure: INSERTION OF MESH;  Surgeon: Axel Filler, MD;  [facility];  Service: General;  Laterality: Right;  . LAPAROSCOPIC GASTRIC SLEEVE  RESECTION N/A 02/09/2017   Procedure: LAPAROSCOPIC GASTRIC SLEEVE RESECTION, UPPER ENDO;  Surgeon: De Blanch Kinsinger, MD;  [facility];  Service: General;  Laterality: N/A;  . SHOULDER ARTHROSCOPY WITH ROTATOR CUFF REPAIR AND SUBACROMIAL DECOMPRESSION Right 11/29/2020   Procedure: SHOULDER ARTHROSCOPY DEBRIDEMENT  WITH SUPERIOR CAPSULAR RECONSTRUCION  ROTATOR CUFF REPAIR AND SUBACROMIAL DECOMPRESSION;  Surgeon: Bjorn Pippin, MD;  Location: [facility];  Service: Orthopedics;  Laterality: Right;  . UPPER GI ENDOSCOPY  02/09/2017   Procedure: UPPER GI ENDOSCOPY;  Surgeon: De Blanch Kinsinger, MD;  [facility];  Service: General;;    There were no vitals filed for this visit.   Subjective Assessment - 01/28/21 1305    Subjective Doing good    Currently in Pain? Yes    Pain Score 2     Pain Location Shoulder    Pain Orientation Right                             [facility] Adult PT Treatment/Exercise - 01/28/21 0001      Shoulder Exercises: Standing   Other Standing Exercises RUE tricep Ext red 2x15    Other Standing Exercises AAROM flex, Ext, IR up back 2x15; RLE ABD  AAROM 2x10      Shoulder Exercises: ROM/Strengthening   UBE (Upper Arm Bike) L1 x3 each  way      Shoulder Exercises: Isometric Strengthening   Flexion Supine;5X5"    Extension 5X5";Supine    External Rotation Supine;5X5"    Internal Rotation 5X5";Supine      Manual Therapy   Manual Therapy Passive ROM;Joint mobilization    Joint Mobilization R GH grades 1    Passive ROM R shoulder in all directons.                    PT Short Term Goals - 01/24/21 1335      PT SHORT TERM GOAL #1   Title Pt will be I with initial HEP    Status Achieved             PT Long Term Goals - 01/14/21 1756      PT LONG TERM GOAL #1   Title Pt will demo R shoulder flexion/abduction WFL with no reports of increased R shoulder pain    Time 12    Period Weeks    Status New     Target Date 04/08/21      PT LONG TERM GOAL #2   Title Pt will demo R shoulder MMT equivalent to L shoulder    Time 12    Period Weeks    Status New    Target Date 04/08/21      PT LONG TERM GOAL #3   Title Pt will demo functional R shoulder IR/ER WNL    Time 12    Period Weeks    Status New    Target Date 04/08/21      PT LONG TERM GOAL #4   Title Pt will report 50% reduction in R shoulder pain with activity    Time 12    Period Weeks    Status New    Target Date 04/08/21                 Plan - 01/28/21 1341    Clinical Impression Statement Pt is doing well within the limitations of protocol. Pain at the end range of PROM. Progressed with AAROM interventions    Examination-Participation Restrictions Community Activity;Interpersonal Relationship    Stability/Clinical Decision Making Stable/Uncomplicated    Rehab Potential Good    PT Frequency 2x / week    PT Duration 12 weeks    PT Treatment/Interventions ADLs/Self Care Home Management;Electrical Stimulation;Cryotherapy;Neuromuscular re-education;Therapeutic exercise;Therapeutic activities;Patient/family education;Manual techniques;Passive range of motion;Vasopneumatic Device    PT Next Visit Plan initiate PROM, AAROM supine progressing to vertical, scap stab with arm below shoulder level, <2-5 lbs operative arm           Patient will benefit from skilled therapeutic intervention in order to improve the following deficits and impairments:  Decreased range of motion,Increased muscle spasms,Impaired UE functional use,Pain,Hypomobility,Decreased strength,Increased edema  Visit Diagnosis: Stiffness of right shoulder, not elsewhere classified  Acute pain of right shoulder  Muscle weakness (generalized)  Localized edema     Problem List Patient Active Problem List   Diagnosis Date Noted  . Obesity 02/09/2017  . Screening for colon cancer   . Preoperative clearance 01/21/2017  . Obstructive sleep apnea  08/13/2009  . DYSPNEA 08/13/2009  . ALLERGY 08/13/2009    [name], PTA 01/28/2021, 1:43 PM  [facility] [address] [place], [place], [address] Phone: [phone]   Fax:  [phone]  Name: XACHARY HAMBLY MRN: [id number] Date of Birth: 1966/11/02

## 2021-01-30 ENCOUNTER — Ambulatory Visit: Payer: No Typology Code available for payment source | Admitting: Physical Therapy

## 2021-01-30 ENCOUNTER — Other Ambulatory Visit: Payer: Self-pay

## 2021-01-30 ENCOUNTER — Encounter: Payer: Self-pay | Admitting: Physical Therapy

## 2021-01-30 DIAGNOSIS — M6281 Muscle weakness (generalized): Secondary | ICD-10-CM

## 2021-01-30 DIAGNOSIS — R600 Localized edema: Secondary | ICD-10-CM

## 2021-01-30 DIAGNOSIS — M25511 Pain in right shoulder: Secondary | ICD-10-CM | POA: Diagnosis not present

## 2021-01-30 DIAGNOSIS — M25611 Stiffness of right shoulder, not elsewhere classified: Secondary | ICD-10-CM

## 2021-01-30 NOTE — Therapy (Signed)
[facility] [place] [facility] [address] [place], [place], [address] Phone: [phone]   Fax:  [phone]  Physical Therapy Treatment  Patient Details  Name: Jorge Strong MRN: [id number] Date of Birth: 05-Jun-1966 Referring Provider (PT): Ilean China Date: 01/30/2021   PT End of Session - 01/30/21 1335    Visit Number 5    Date for PT Re-Evaluation 03/16/21    PT Start Time 1300    PT Stop Time 1335    PT Time Calculation (min) 35 min    Activity Tolerance Patient tolerated treatment well    Behavior During Therapy [facility] for tasks assessed/performed           Past Medical History:  Diagnosis Date  . Closed rib fracture    past hx, recent 3 days ago fell left side"? a new left rib fracture"-has not been confirmed.  . Enlarged prostate   . GERD (gastroesophageal reflux disease)   . H/O seasonal allergies   . Sleep apnea    hx of sleep apnea, no longer an issue after weight loss  . Wound of left leg    left leg wound is healing, but remains draining clear serous-covering over wound.    Past Surgical History:  Procedure Laterality Date  . APPENDECTOMY     age 36  . BICEPT TENODESIS Right 11/29/2020   Procedure: BICEPS TENODESIS;  Surgeon: Bjorn Pippin, MD;  Location: [facility];  Service: Orthopedics;  Laterality: Right;  . COLONOSCOPY WITH PROPOFOL N/A 02/05/2017   Procedure: COLONOSCOPY WITH PROPOFOL;  Surgeon: Sherrilyn Rist, MD;  [facility];  Service: Gastroenterology;  Laterality: N/A;  . INGUINAL HERNIA REPAIR Right 11/02/2015   Procedure: LAPAROSCOPIC RIGHT INGUINAL HERNIA REPAIR WITH MESH;  Surgeon: Axel Filler, MD;  Location: [facility];  Service: General;  Laterality: Right;  . INSERTION OF MESH Right 11/02/2015   Procedure: INSERTION OF MESH;  Surgeon: Axel Filler, MD;  [facility]: [facility];  Service: General;  Laterality: Right;  . LAPAROSCOPIC GASTRIC SLEEVE RESECTION N/A 02/09/2017    Procedure: LAPAROSCOPIC GASTRIC SLEEVE RESECTION, UPPER ENDO;  Surgeon: De Blanch Kinsinger, MD;  Location: [facility];  Service: General;  Laterality: N/A;  . SHOULDER ARTHROSCOPY WITH ROTATOR CUFF REPAIR AND SUBACROMIAL DECOMPRESSION Right 11/29/2020   Procedure: SHOULDER ARTHROSCOPY DEBRIDEMENT  WITH SUPERIOR CAPSULAR RECONSTRUCION  ROTATOR CUFF REPAIR AND SUBACROMIAL DECOMPRESSION;  Surgeon: Bjorn Pippin, MD;  Location: [facility];  Service: Orthopedics;  Laterality: Right;  . UPPER GI ENDOSCOPY  02/09/2017   Procedure: UPPER GI ENDOSCOPY;  Surgeon: De Blanch Kinsinger, MD;  Location[facility];  Service: General;;    There were no vitals filed for this visit.   Subjective Assessment - 01/30/21 1304    Subjective "Good"    Limitations Lifting;House hold activities    Currently in Pain? Yes    Pain Score 2     Pain Location Shoulder    Pain Orientation Right                             [facility] Adult PT Treatment/Exercise - 01/30/21 0001      Shoulder Exercises: Standing   Extension Theraband;20 reps;Strengthening;Both    Theraband Level (Shoulder Extension) Level 1 (Yellow)    Row Theraband;20 reps;Strengthening;Both    Theraband Level (Shoulder Row) Level 1 (Yellow)    Other Standing Exercises AAROM flex, Ext, IR up  back 2x15; RLE ABD  AAROM 2x10      Shoulder Exercises: ROM/Strengthening   UBE (Upper Arm Bike) L1 x3 each way      Manual Therapy   Manual Therapy Passive ROM;Joint mobilization    Joint Mobilization R GH grades 1    Passive ROM R shoulder in all directons.                    PT Short Term Goals - 01/24/21 1335      PT SHORT TERM GOAL #1   Title Pt will be I with initial HEP    Status Achieved             PT Long Term Goals - 01/14/21 1756      PT LONG TERM GOAL #1   Title Pt will demo R shoulder flexion/abduction WFL with no reports of increased R shoulder pain    Time 12    Period Weeks    Status New     Target Date 04/08/21      PT LONG TERM GOAL #2   Title Pt will demo R shoulder MMT equivalent to L shoulder    Time 12    Period Weeks    Status New    Target Date 04/08/21      PT LONG TERM GOAL #3   Title Pt will demo functional R shoulder IR/ER WNL    Time 12    Period Weeks    Status New    Target Date 04/08/21      PT LONG TERM GOAL #4   Title Pt will report 50% reduction in R shoulder pain with activity    Time 12    Period Weeks    Status New    Target Date 04/08/21                 Plan - 01/30/21 1335    Clinical Impression Statement Pt did well with the addition of light scapula retractions interventions. Postural cues given not to lean back with AAROM flexion. Positive response to scapular anchoring with RUE passive flexion evident buy increase PROM    Examination-Participation Restrictions Community Activity;Interpersonal Relationship    Stability/Clinical Decision Making Stable/Uncomplicated    Rehab Potential Good    PT Frequency 2x / week    PT Duration 12 weeks    PT Treatment/Interventions ADLs/Self Care Home Management;Electrical Stimulation;Cryotherapy;Neuromuscular re-education;Therapeutic exercise;Therapeutic activities;Patient/family education;Manual techniques;Passive range of motion;Vasopneumatic Device    PT Next Visit Plan initiate PROM, AAROM supine progressing to vertical, scap stab with arm below shoulder level, <2-5 lbs operative arm           Patient will benefit from skilled therapeutic intervention in order to improve the following deficits and impairments:  Decreased range of motion,Increased muscle spasms,Impaired UE functional use,Pain,Hypomobility,Decreased strength,Increased edema  Visit Diagnosis: Stiffness of right shoulder, not elsewhere classified  Acute pain of right shoulder  Muscle weakness (generalized)  Localized edema     Problem List Patient Active Problem List   Diagnosis Date Noted  . Obesity 02/09/2017   . Screening for colon cancer   . Preoperative clearance 01/21/2017  . Obstructive sleep apnea 08/13/2009  . DYSPNEA 08/13/2009  . ALLERGY 08/13/2009    [name] 01/30/2021, 1:38 PM  [facility] [place] [facility] [address] [place], [place], [address] Phone: [phone]   Fax:  [phone]  Name: Jorge Strong MRN: [id number] Date of Birth: June 11, 1966

## 2021-02-06 ENCOUNTER — Other Ambulatory Visit: Payer: Self-pay

## 2021-02-06 ENCOUNTER — Ambulatory Visit: Payer: No Typology Code available for payment source | Admitting: Physical Therapy

## 2021-02-06 ENCOUNTER — Encounter: Payer: Self-pay | Admitting: Physical Therapy

## 2021-02-06 DIAGNOSIS — M25611 Stiffness of right shoulder, not elsewhere classified: Secondary | ICD-10-CM

## 2021-02-06 DIAGNOSIS — M25511 Pain in right shoulder: Secondary | ICD-10-CM | POA: Diagnosis not present

## 2021-02-06 DIAGNOSIS — R600 Localized edema: Secondary | ICD-10-CM

## 2021-02-06 DIAGNOSIS — M6281 Muscle weakness (generalized): Secondary | ICD-10-CM

## 2021-02-06 NOTE — Therapy (Signed)
[facility] [place] [facility] [address] [place], [place], [address] Phone: [phone]   Fax:  [phone]  Physical Therapy Treatment  Patient Details  Name: Jorge Strong MRN: [id number] Date of Birth: [birth year] Referring Provider (PT): Ilean China Date: 02/06/2021   PT End of Session - 02/06/21 1056    Visit Number 6    Date for PT Re-Evaluation 03/16/21    PT Start Time 1015    PT Stop Time 1058    PT Time Calculation (min) 43 min    Activity Tolerance Patient tolerated treatment well    Behavior During Therapy [facility] for tasks assessed/performed           Past Medical History:  Diagnosis Date  . Closed rib fracture    past hx, recent 3 days ago fell left side"? a new left rib fracture"-has not been confirmed.  . Enlarged prostate   . GERD (gastroesophageal reflux disease)   . H/O seasonal allergies   . Sleep apnea    hx of sleep apnea, no longer an issue after weight loss  . Wound of left leg    left leg wound is healing, but remains draining clear serous-covering over wound.    Past Surgical History:  Procedure Laterality Date  . APPENDECTOMY     age 55  . BICEPT TENODESIS Right 11/29/2020   Procedure: BICEPS TENODESIS;  Surgeon: Bjorn Pippin, MD;  Location: [facility];  Service: Orthopedics;  Laterality: Right;  . COLONOSCOPY WITH PROPOFOL N/A 02/05/2017   Procedure: COLONOSCOPY WITH PROPOFOL;  Surgeon: Sherrilyn Rist, MD;  [facility];  Service: Gastroenterology;  Laterality: N/A;  . INGUINAL HERNIA REPAIR Right 11/02/2015   Procedure: LAPAROSCOPIC RIGHT INGUINAL HERNIA REPAIR WITH MESH;  Surgeon: Axel Filler, MD;  [facility];  Service: General;  Laterality: Right;  . INSERTION OF MESH Right 11/02/2015   Procedure: INSERTION OF MESH;  Surgeon: Axel Filler, MD;  [facility];  Service: General;  Laterality: Right;  . LAPAROSCOPIC GASTRIC SLEEVE RESECTION N/A 02/09/2017    Procedure: LAPAROSCOPIC GASTRIC SLEEVE RESECTION, UPPER ENDO;  Surgeon: De Blanch Kinsinger, MD;  [facility];  Service: General;  Laterality: N/A;  . SHOULDER ARTHROSCOPY WITH ROTATOR CUFF REPAIR AND SUBACROMIAL DECOMPRESSION Right 11/29/2020   Procedure: SHOULDER ARTHROSCOPY DEBRIDEMENT  WITH SUPERIOR CAPSULAR RECONSTRUCION  ROTATOR CUFF REPAIR AND SUBACROMIAL DECOMPRESSION;  Surgeon: Bjorn Pippin, MD;  Location: [facility];  Service: Orthopedics;  Laterality: Right;  . UPPER GI ENDOSCOPY  02/09/2017   Procedure: UPPER GI ENDOSCOPY;  Surgeon: De Blanch Kinsinger, MD;  [facility];  Service: General;;    There were no vitals filed for this visit.   Subjective Assessment - 02/06/21 1017    Subjective "Im feeling good, the normal one and two"    Currently in Pain? Yes    Pain Score 2     Pain Location Shoulder    Pain Orientation Right                             [facility] Adult PT Treatment/Exercise - 02/06/21 0001      Shoulder Exercises: Standing   Extension Theraband;Both;Strengthening;20 reps   x2   Theraband Level (Shoulder Extension) Level 2 (Red)    Row Theraband;20 reps;Strengthening;Both   x2   Theraband Level (Shoulder Row) Level 2 (Red)    Other Standing Exercises Flex  and Abd up wall with pilow case 2x10    Other Standing Exercises AAROM flex, Ext, IR up back 2x10; RLE ABD  AAROM 2x10; Ball on wall small circles      Shoulder Exercises: Isometric Strengthening   Other Isometric Exercises 3 step green ER & IR x5 each      Manual Therapy   Manual Therapy Passive ROM;Joint mobilization    Joint Mobilization R GH grades 2    Passive ROM R shoulder in all directons.                    PT Short Term Goals - 01/24/21 1335      PT SHORT TERM GOAL #1   Title Pt will be I with initial HEP    Status Achieved             PT Long Term Goals - 01/14/21 1756      PT LONG TERM GOAL #1   Title Pt will demo R shoulder  flexion/abduction WFL with no reports of increased R shoulder pain    Time 12    Period Weeks    Status New    Target Date 04/08/21      PT LONG TERM GOAL #2   Title Pt will demo R shoulder MMT equivalent to L shoulder    Time 12    Period Weeks    Status New    Target Date 04/08/21      PT LONG TERM GOAL #3   Title Pt will demo functional R shoulder IR/ER WNL    Time 12    Period Weeks    Status New    Target Date 04/08/21      PT LONG TERM GOAL #4   Title Pt will report 50% reduction in R shoulder pain with activity    Time 12    Period Weeks    Status New    Target Date 04/08/21                 Plan - 02/06/21 1100    Clinical Impression Statement Added additional ROM interventions today. Some discomfort reported with flex and abd against wall at the end range. Resisted band isometrics exposes some weakness with internal rotation. Some cues given to relax with MT. R shoulder bracing needed to prevent compensation with internal rotation.    Examination-Participation Restrictions Community Activity;Interpersonal Relationship    Stability/Clinical Decision Making Stable/Uncomplicated    Rehab Potential Good    PT Frequency 2x / week    PT Duration 12 weeks    PT Next Visit Plan initiate PROM, AAROM supine progressing to vertical, scap stab with arm below shoulder level, <2-5 lbs operative arm           Patient will benefit from skilled therapeutic intervention in order to improve the following deficits and impairments:  Abnormal gait  Visit Diagnosis: Muscle weakness (generalized)  Localized edema  Acute pain of right shoulder  Stiffness of right shoulder, not elsewhere classified     Problem List Patient Active Problem List   Diagnosis Date Noted  . Obesity 02/09/2017  . Screening for colon cancer   . Preoperative clearance 01/21/2017  . Obstructive sleep apnea 08/13/2009  . DYSPNEA 08/13/2009  . ALLERGY 08/13/2009    Jorge Strong,  PTA 02/06/2021, 11:02 AM  [facility] [address] [place], [place], [address] Phone: [phone]   Fax:  [phone]  Name: Jorge Strong MRN: [id number] Date of Birth: Jun 29, 1966

## 2021-02-07 ENCOUNTER — Ambulatory Visit: Payer: No Typology Code available for payment source | Admitting: Physical Therapy

## 2021-02-07 ENCOUNTER — Encounter: Payer: Self-pay | Admitting: Physical Therapy

## 2021-02-07 DIAGNOSIS — M6281 Muscle weakness (generalized): Secondary | ICD-10-CM

## 2021-02-07 DIAGNOSIS — M25511 Pain in right shoulder: Secondary | ICD-10-CM

## 2021-02-07 DIAGNOSIS — R600 Localized edema: Secondary | ICD-10-CM

## 2021-02-07 DIAGNOSIS — M25611 Stiffness of right shoulder, not elsewhere classified: Secondary | ICD-10-CM

## 2021-02-07 MED FILL — MOMETASONE FUROATE 50 MCG S: 50 | 90 days supply | Qty: 51 | Fill #3

## 2021-02-07 NOTE — Therapy (Signed)
[facility] [place] [facility] [address] [place], [place], [address] Phone: [phone]   Fax:  [phone]  Physical Therapy Treatment  Patient Details  Name: COVEY BALLER MRN: [id number] Date of Birth: 1966-02-08 Referring Provider (PT): Ilean China Date: 02/07/2021   PT End of Session - 02/07/21 1057    Visit Number 7    Date for PT Re-Evaluation 03/16/21    PT Start Time 1015    PT Stop Time 1057    PT Time Calculation (min) 42 min    Activity Tolerance Patient tolerated treatment well    Behavior During Therapy [facility] for tasks assessed/performed           Past Medical History:  Diagnosis Date  . Closed rib fracture    past hx, recent 3 days ago fell left side"? a new left rib fracture"-has not been confirmed.  . Enlarged prostate   . GERD (gastroesophageal reflux disease)   . H/O seasonal allergies   . Sleep apnea    hx of sleep apnea, no longer an issue after weight loss  . Wound of left leg    left leg wound is healing, but remains draining clear serous-covering over wound.    Past Surgical History:  Procedure Laterality Date  . APPENDECTOMY     age 34  . BICEPT TENODESIS Right 11/29/2020   Procedure: BICEPS TENODESIS;  Surgeon: Bjorn Pippin, MD;  Location: [facility];  Service: Orthopedics;  Laterality: Right;  . COLONOSCOPY WITH PROPOFOL N/A 02/05/2017   Procedure: COLONOSCOPY WITH PROPOFOL;  Surgeon: Sherrilyn Rist, MD;  [facility];  Service: Gastroenterology;  Laterality: N/A;  . INGUINAL HERNIA REPAIR Right 11/02/2015   Procedure: LAPAROSCOPIC RIGHT INGUINAL HERNIA REPAIR WITH MESH;  Surgeon: Axel Filler, MD;  [facility];  Service: General;  Laterality: Right;  . INSERTION OF MESH Right 11/02/2015   Procedure: INSERTION OF MESH;  Surgeon: Axel Filler, MD;  [facility];  Service: General;  Laterality: Right;  . LAPAROSCOPIC GASTRIC SLEEVE RESECTION N/A 02/09/2017    Procedure: LAPAROSCOPIC GASTRIC SLEEVE RESECTION, UPPER ENDO;  Surgeon: De Blanch Kinsinger, MD;  [facility];  Service: General;  Laterality: N/A;  . SHOULDER ARTHROSCOPY WITH ROTATOR CUFF REPAIR AND SUBACROMIAL DECOMPRESSION Right 11/29/2020   Procedure: SHOULDER ARTHROSCOPY DEBRIDEMENT  WITH SUPERIOR CAPSULAR RECONSTRUCION  ROTATOR CUFF REPAIR AND SUBACROMIAL DECOMPRESSION;  Surgeon: Bjorn Pippin, MD;  Location: [facility];  Service: Orthopedics;  Laterality: Right;  . UPPER GI ENDOSCOPY  02/09/2017   Procedure: UPPER GI ENDOSCOPY;  Surgeon: De Blanch Kinsinger, MD;  [facility];  Service: General;;    There were no vitals filed for this visit.   Subjective Assessment - 02/07/21 1017    Subjective "Good"    Currently in Pain? Yes    Pain Score 1     Pain Location Shoulder    Pain Orientation Right                             [facility] Adult PT Treatment/Exercise - 02/07/21 0001      Shoulder Exercises: Standing   Extension Theraband;Both;Strengthening;15 reps   x2   Theraband Level (Shoulder Extension) Level 2 (Red)    Row Theraband;Strengthening;Both;15 reps   x2   Theraband Level (Shoulder Row) Level 2 (Red)    Other Standing Exercises Flex and Abd up wall with pilow case  2x10; Tricep Ext green 2x15    Other Standing Exercises AAROM flex, Ext, IR up back 2x10; RLE ABD  AAROM 2x10; Ball on wall small circles      Shoulder Exercises: ROM/Strengthening   Nustep L5 x 6 min      Shoulder Exercises: Isometric Strengthening   Other Isometric Exercises 3 step green ER, IR, Flex, & ext x5 each      Manual Therapy   Manual Therapy Passive ROM;Joint mobilization    Joint Mobilization R GH grades 2    Passive ROM R shoulder in all directons.                    PT Short Term Goals - 01/24/21 1335      PT SHORT TERM GOAL #1   Title Pt will be I with initial HEP    Status Achieved             PT Long Term Goals - 02/07/21 1100       PT LONG TERM GOAL #1   Title Pt will demo R shoulder flexion/abduction WFL with no reports of increased R shoulder pain    Status On-going      PT LONG TERM GOAL #2   Title Pt will demo R shoulder MMT equivalent to L shoulder    Status On-going                 Plan - 02/07/21 1057    Clinical Impression Statement Pt continues to do well overall. Added additional isometric interventions today. He report reports the most difficulty with flexion isometric. Postural and arm positional cues needed with standing active assisted flexion. Cues to relax with MT, reports he can tell his passive motion is better    Examination-Participation Restrictions Community Activity;Interpersonal Relationship    Stability/Clinical Decision Making Stable/Uncomplicated    Rehab Potential Good    PT Frequency 2x / week    PT Duration 12 weeks    PT Treatment/Interventions ADLs/Self Care Home Management;Electrical Stimulation;Cryotherapy;Neuromuscular re-education;Therapeutic exercise;Therapeutic activities;Patient/family education;Manual techniques;Passive range of motion;Vasopneumatic Device    PT Next Visit Plan initiate PROM, AAROM supine progressing to vertical, scap stab with arm below shoulder level, <2-5 lbs operative arm           Patient will benefit from skilled therapeutic intervention in order to improve the following deficits and impairments:  Abnormal gait  Visit Diagnosis: Localized edema  Acute pain of right shoulder  Stiffness of right shoulder, not elsewhere classified  Muscle weakness (generalized)     Problem List Patient Active Problem List   Diagnosis Date Noted  . Obesity 02/09/2017  . Screening for colon cancer   . Preoperative clearance 01/21/2017  . Obstructive sleep apnea 08/13/2009  . DYSPNEA 08/13/2009  . ALLERGY 08/13/2009    [name], PTA 02/07/2021, 11:03 AM  [facility] [place] [address] [place], [place], [address] Phone: [phone]   Fax:  [phone]  Name: CANON GOLA MRN: [id number] Date of Birth: February 24, 1966

## 2021-02-13 ENCOUNTER — Other Ambulatory Visit: Payer: Self-pay

## 2021-02-13 ENCOUNTER — Ambulatory Visit: Payer: No Typology Code available for payment source | Attending: Orthopedic Surgery | Admitting: Physical Therapy

## 2021-02-13 ENCOUNTER — Encounter: Payer: Self-pay | Admitting: Physical Therapy

## 2021-02-13 DIAGNOSIS — M6281 Muscle weakness (generalized): Secondary | ICD-10-CM | POA: Insufficient documentation

## 2021-02-13 DIAGNOSIS — M25511 Pain in right shoulder: Secondary | ICD-10-CM | POA: Insufficient documentation

## 2021-02-13 DIAGNOSIS — R600 Localized edema: Secondary | ICD-10-CM | POA: Insufficient documentation

## 2021-02-13 DIAGNOSIS — M25611 Stiffness of right shoulder, not elsewhere classified: Secondary | ICD-10-CM | POA: Diagnosis present

## 2021-02-13 NOTE — Therapy (Signed)
[facility] [address] [place], [place], [address] Phone: [phone]   Fax:  [phone]  Physical Therapy Treatment  Patient Details  Name: Jorge Strong MRN: [id number] Date of Birth: 1966/03/27 Referring Provider (PT): Ilean China Date: 02/13/2021   PT End of Session - 02/13/21 1557    Visit Number 8    Date for PT Re-Evaluation 03/16/21    PT Start Time 1515    PT Stop Time 1559    PT Time Calculation (min) 44 min    Activity Tolerance Patient tolerated treatment well    Behavior During Therapy [facility] for tasks assessed/performed           Past Medical History:  Diagnosis Date  . Closed rib fracture    past hx, recent 3 days ago fell left side"? a new left rib fracture"-has not been confirmed.  . Enlarged prostate   . GERD (gastroesophageal reflux disease)   . H/O seasonal allergies   . Sleep apnea    hx of sleep apnea, no longer an issue after weight loss  . Wound of left leg    left leg wound is healing, but remains draining clear serous-covering over wound.    Past Surgical History:  Procedure Laterality Date  . APPENDECTOMY     age 25  . BICEPT TENODESIS Right 11/29/2020   Procedure: BICEPS TENODESIS;  Surgeon: Bjorn Pippin, MD;  Location: [facility];  Service: Orthopedics;  Laterality: Right;  . COLONOSCOPY WITH PROPOFOL N/A 02/05/2017   Procedure: COLONOSCOPY WITH PROPOFOL;  Surgeon: Sherrilyn Rist, MD;  [facility];  Service: Gastroenterology;  Laterality: N/A;  . INGUINAL HERNIA REPAIR Right 11/02/2015   Procedure: LAPAROSCOPIC RIGHT INGUINAL HERNIA REPAIR WITH MESH;  Surgeon: Axel Filler, MD;  Location: [facility];  Service: General;  Laterality: Right;  . INSERTION OF MESH Right 11/02/2015   Procedure: INSERTION OF MESH;  Surgeon: Axel Filler, MD;  Location: [facility];  Service: General;  Laterality: Right;  . LAPAROSCOPIC GASTRIC SLEEVE RESECTION N/A 02/09/2017    Procedure: LAPAROSCOPIC GASTRIC SLEEVE RESECTION, UPPER ENDO;  Surgeon: De Blanch Kinsinger, MD;  Location: [facility];  Service: General;  Laterality: N/A;  . SHOULDER ARTHROSCOPY WITH ROTATOR CUFF REPAIR AND SUBACROMIAL DECOMPRESSION Right 11/29/2020   Procedure: SHOULDER ARTHROSCOPY DEBRIDEMENT  WITH SUPERIOR CAPSULAR RECONSTRUCION  ROTATOR CUFF REPAIR AND SUBACROMIAL DECOMPRESSION;  Surgeon: Bjorn Pippin, MD;  Location: [facility];  Service: Orthopedics;  Laterality: Right;  . UPPER GI ENDOSCOPY  02/09/2017   Procedure: UPPER GI ENDOSCOPY;  Surgeon: De Blanch Kinsinger, MD;  Location: [facility];  Service: General;;    There were no vitals filed for this visit.   Subjective Assessment - 02/13/21 1517    Subjective "Im good, Im feeling good, lets say a 1"    Limitations Lifting;House hold activities    Currently in Pain? Yes    Pain Location Shoulder    Pain Orientation Right                             [facility] Adult PT Treatment/Exercise - 02/13/21 0001      Shoulder Exercises: Standing   External Rotation 20 reps;Strengthening;Theraband;Right    Theraband Level (Shoulder External Rotation) Level 1 (Yellow)    Other Standing Exercises Flex and Abd up wall with pilow case 2x10; Tricep Ext green 2x15    Other Standing  Exercises 4 level cabinet reaches 1lb flex & abd x10      Shoulder Exercises: ROM/Strengthening   UBE (Upper Arm Bike) L1 x3 each way    Other ROM/Strengthening Exercises Rows & Lats 20lb 2x10      Manual Therapy   Manual Therapy Passive ROM;Joint mobilization    Joint Mobilization R GH grades 2    Passive ROM R shoulder in all directons.                    PT Short Term Goals - 01/24/21 1335      PT SHORT TERM GOAL #1   Title Pt will be I with initial HEP    Status Achieved             PT Long Term Goals - 02/07/21 1100      PT LONG TERM GOAL #1   Title Pt will demo R shoulder flexion/abduction WFL with no reports of  increased R shoulder pain    Status On-going      PT LONG TERM GOAL #2   Title Pt will demo R shoulder MMT equivalent to L shoulder    Status On-going                 Plan - 02/13/21 1558    Clinical Impression Statement Pt 10 weeks 6 days out from surgery. Progress to some light UE strengthening with good posture and control. Pt did well overall today. Postural cues required with seated rows. Tactile cues for arm placement needed with external and internal rotation. Some difficulty with the upper level cabinet reaches, requiring some compensation. PROM is improving with IR being biggest limitation.    Examination-Participation Restrictions Community Activity;Interpersonal Relationship    Stability/Clinical Decision Making Stable/Uncomplicated    Rehab Potential Good    PT Frequency 2x / week    PT Duration 12 weeks    PT Treatment/Interventions ADLs/Self Care Home Management;Electrical Stimulation;Cryotherapy;Neuromuscular re-education;Therapeutic exercise;Therapeutic activities;Patient/family education;Manual techniques;Passive range of motion;Vasopneumatic Device    PT Next Visit Plan initiate PROM, AAROM supine progressing to vertical, scap stab with arm below shoulder level, <2-5 lbs operative arm           Patient will benefit from skilled therapeutic intervention in order to improve the following deficits and impairments:  Abnormal gait  Visit Diagnosis: Localized edema  Muscle weakness (generalized)  Stiffness of right shoulder, not elsewhere classified  Acute pain of right shoulder     Problem List Patient Active Problem List   Diagnosis Date Noted  . Obesity 02/09/2017  . Screening for colon cancer   . Preoperative clearance 01/21/2017  . Obstructive sleep apnea 08/13/2009  . DYSPNEA 08/13/2009  . ALLERGY 08/13/2009    [name] 02/13/2021, 4:01 PM  [facility] [address] [place], [place], [address] Phone: [phone]   Fax:  [phone]  Name: Jorge Strong MRN: [id number] Date of Birth: 1965/12/28

## 2021-02-15 ENCOUNTER — Ambulatory Visit: Payer: No Typology Code available for payment source | Admitting: Physical Therapy

## 2021-02-15 ENCOUNTER — Other Ambulatory Visit: Payer: Self-pay

## 2021-02-15 ENCOUNTER — Encounter: Payer: Self-pay | Admitting: Physical Therapy

## 2021-02-15 DIAGNOSIS — M6281 Muscle weakness (generalized): Secondary | ICD-10-CM

## 2021-02-15 DIAGNOSIS — M25511 Pain in right shoulder: Secondary | ICD-10-CM

## 2021-02-15 DIAGNOSIS — R600 Localized edema: Secondary | ICD-10-CM | POA: Diagnosis not present

## 2021-02-15 DIAGNOSIS — M25611 Stiffness of right shoulder, not elsewhere classified: Secondary | ICD-10-CM

## 2021-02-15 NOTE — Therapy (Signed)
[facility] [address] [place], [place], [address] Phone: [phone]   Fax:  [phone]  Physical Therapy Treatment  Patient Details  Name: Jorge Strong MRN: [id number] Date of Birth: 11-24-1965 Referring Provider (PT): Ilean China Date: 02/15/2021   PT End of Session - 02/15/21 0851    Visit Number 9    Date for PT Re-Evaluation 03/16/21    PT Start Time 0802    PT Stop Time 0841    PT Time Calculation (min) 39 min           Past Medical History:  Diagnosis Date  . Closed rib fracture    past hx, recent 3 days ago fell left side"? a new left rib fracture"-has not been confirmed.  . Enlarged prostate   . GERD (gastroesophageal reflux disease)   . H/O seasonal allergies   . Sleep apnea    hx of sleep apnea, no longer an issue after weight loss  . Wound of left leg    left leg wound is healing, but remains draining clear serous-covering over wound.    Past Surgical History:  Procedure Laterality Date  . APPENDECTOMY     age 77  . BICEPT TENODESIS Right 11/29/2020   Procedure: BICEPS TENODESIS;  Surgeon: Bjorn Pippin, MD;  Location: [facility];  Service: Orthopedics;  Laterality: Right;  . COLONOSCOPY WITH PROPOFOL N/A 02/05/2017   Procedure: COLONOSCOPY WITH PROPOFOL;  Surgeon: Sherrilyn Rist, MD;  [facility];  Service: Gastroenterology;  Laterality: N/A;  . INGUINAL HERNIA REPAIR Right 11/02/2015   Procedure: LAPAROSCOPIC RIGHT INGUINAL HERNIA REPAIR WITH MESH;  Surgeon: Axel Filler, MD;  [facility];  Service: General;  Laterality: Right;  . INSERTION OF MESH Right 11/02/2015   Procedure: INSERTION OF MESH;  Surgeon: Axel Filler, MD;  [facility];  Service: General;  Laterality: Right;  . LAPAROSCOPIC GASTRIC SLEEVE RESECTION N/A 02/09/2017   Procedure: LAPAROSCOPIC GASTRIC SLEEVE RESECTION, UPPER ENDO;  Surgeon: De Blanch Kinsinger, MD;  [facility];   Service: General;  Laterality: N/A;  . SHOULDER ARTHROSCOPY WITH ROTATOR CUFF REPAIR AND SUBACROMIAL DECOMPRESSION Right 11/29/2020   Procedure: SHOULDER ARTHROSCOPY DEBRIDEMENT  WITH SUPERIOR CAPSULAR RECONSTRUCION  ROTATOR CUFF REPAIR AND SUBACROMIAL DECOMPRESSION;  Surgeon: Bjorn Pippin, MD;  Location: [facility];  Service: Orthopedics;  Laterality: Right;  . UPPER GI ENDOSCOPY  02/09/2017   Procedure: UPPER GI ENDOSCOPY;  Surgeon: De Blanch Kinsinger, MD;  [facility];  Service: General;;    There were no vitals filed for this visit.   Subjective Assessment - 02/15/21 0803    Subjective Feeling good, "ready to be back to normal"    Currently in Pain? Yes    Pain Score 1     Pain Location Shoulder    Pain Orientation Right              [facility] PT Assessment - 02/15/21 0001      ROM / Strength   AROM / PROM / Strength AROM      AROM   AROM Assessment Site Shoulder    Right/Left Shoulder Right    Right Shoulder Flexion 134 Degrees    Right Shoulder ABduction 125 Degrees    Right Shoulder Internal Rotation 42 Degrees    Right Shoulder External Rotation 60 Degrees  [facility] Adult PT Treatment/Exercise - 02/15/21 0001      Shoulder Exercises: Standing   External Rotation Strengthening;Right;20 reps    Theraband Level (Shoulder External Rotation) Level 1 (Yellow)    Internal Rotation Strengthening;Right;20 reps    Theraband Level (Shoulder Internal Rotation) Level 1 (Yellow)    Other Standing Exercises Flex and Abd up wall with pilow case 1x10    Other Standing Exercises Flex and Abd up wall with small ball 2x10, 1x10 1#      Shoulder Exercises: ROM/Strengthening   UBE (Upper Arm Bike) L1.5x 3each way    Other ROM/Strengthening Exercises Rows & Lats 20lb 2x10      Manual Therapy   Manual Therapy Passive ROM    Passive ROM R shoulder in all directions                    PT Short Term Goals - 01/24/21 1335       PT SHORT TERM GOAL #1   Title Pt will be I with initial HEP    Status Achieved             PT Long Term Goals - 02/07/21 1100      PT LONG TERM GOAL #1   Title Pt will demo R shoulder flexion/abduction WFL with no reports of increased R shoulder pain    Status On-going      PT LONG TERM GOAL #2   Title Pt will demo R shoulder MMT equivalent to L shoulder    Status On-going                 Plan - 02/15/21 0846    Clinical Impression Statement Pt contiued to tolerate UE strengthening  well, minimal verbal and tactile cues needed for good posture/form with standing ER/IR.Compensation noted with weighted arm ball rolls on wall.  AROM is improving as indicated by ROM measurements with IR continuing to be biggest limitation.    Examination-Participation Restrictions Community Activity;Interpersonal Relationship    Stability/Clinical Decision Making Stable/Uncomplicated    Rehab Potential Good    PT Frequency 2x / week    PT Duration 12 weeks    PT Treatment/Interventions ADLs/Self Care Home Management;Electrical Stimulation;Cryotherapy;Neuromuscular re-education;Therapeutic exercise;Therapeutic activities;Patient/family education;Manual techniques;Passive range of motion;Vasopneumatic Device    PT Next Visit Plan Continue ROM and UE strengtheing as indicated           Patient will benefit from skilled therapeutic intervention in order to improve the following deficits and impairments:  Abnormal gait  Visit Diagnosis: Muscle weakness (generalized)  Stiffness of right shoulder, not elsewhere classified  Acute pain of right shoulder     Problem List Patient Active Problem List   Diagnosis Date Noted  . Obesity 02/09/2017  . Screening for colon cancer   . Preoperative clearance 01/21/2017  . Obstructive sleep apnea 08/13/2009  . DYSPNEA 08/13/2009  . ALLERGY 08/13/2009    Jorge Strong, SPTA 02/15/2021, 8:52 AM  [facility] [address] [place], [place], [address] Phone: [phone]   Fax:  [phone]  Name: Jorge Strong MRN: [id number] Date of Birth: 1965-12-21

## 2021-02-19 ENCOUNTER — Ambulatory Visit: Payer: No Typology Code available for payment source | Admitting: Physical Therapy

## 2021-02-19 ENCOUNTER — Encounter: Payer: Self-pay | Admitting: Physical Therapy

## 2021-02-19 ENCOUNTER — Other Ambulatory Visit: Payer: Self-pay

## 2021-02-19 ENCOUNTER — Encounter (HOSPITAL_BASED_OUTPATIENT_CLINIC_OR_DEPARTMENT_OTHER): Payer: No Typology Code available for payment source | Admitting: Internal Medicine

## 2021-02-19 DIAGNOSIS — R600 Localized edema: Secondary | ICD-10-CM

## 2021-02-19 DIAGNOSIS — M25611 Stiffness of right shoulder, not elsewhere classified: Secondary | ICD-10-CM

## 2021-02-19 DIAGNOSIS — M6281 Muscle weakness (generalized): Secondary | ICD-10-CM

## 2021-02-19 DIAGNOSIS — M25511 Pain in right shoulder: Secondary | ICD-10-CM

## 2021-02-19 NOTE — Therapy (Signed)
[address]. [place], [place], [address] Phone: [phone]   Fax:  [phone]  Physical Therapy Treatment  Patient Details  Name: PHEONIX CLINKSCALE MRN: [id number] Date of Birth: 28-Feb-1966 Referring Provider (PT): Colin Broach Date: 02/19/2021   PT End of Session - 02/19/21 1540    Visit Number 10    Date for PT Re-Evaluation 03/16/21    PT Start Time 1600    PT Stop Time 1644    PT Time Calculation (min) 44 min    Activity Tolerance Patient tolerated treatment well    Behavior During Therapy [facility] for tasks assessed/performed           Past Medical History:  Diagnosis Date  . Closed rib fracture    past hx, recent 3 days ago fell left side"? a new left rib fracture"-has not been confirmed.  . Enlarged prostate   . GERD (gastroesophageal reflux disease)   . H/O seasonal allergies   . Sleep apnea    hx of sleep apnea, no longer an issue after weight loss  . Wound of left leg    left leg wound is healing, but remains draining clear serous-covering over wound.    Past Surgical History:  Procedure Laterality Date  . APPENDECTOMY     age 89  . BICEPT TENODESIS Right 11/29/2020   Procedure: BICEPS TENODESIS;  Surgeon: Hiram Gash, MD;  Location: [facility];  Service: Orthopedics;  Laterality: Right;  . COLONOSCOPY WITH PROPOFOL N/A 02/05/2017   Procedure: COLONOSCOPY WITH PROPOFOL;  Surgeon: Doran Stabler, MD;  [facility];  Service: Gastroenterology;  Laterality: N/A;  . INGUINAL HERNIA REPAIR Right 11/02/2015   Procedure: LAPAROSCOPIC RIGHT INGUINAL HERNIA REPAIR WITH MESH;  Surgeon: Ralene Ok, MD;  [facility];  Service: General;  Laterality: Right;  . INSERTION OF MESH Right 11/02/2015   Procedure: INSERTION OF MESH;  Surgeon: Ralene Ok, MD;  [facility];  Service: General;  Laterality: Right;  . LAPAROSCOPIC GASTRIC SLEEVE RESECTION N/A 02/09/2017    Procedure: LAPAROSCOPIC GASTRIC SLEEVE RESECTION, UPPER ENDO;  Surgeon: Arta Bruce Kinsinger, MD;  [facility];  Service: General;  Laterality: N/A;  . SHOULDER ARTHROSCOPY WITH ROTATOR CUFF REPAIR AND SUBACROMIAL DECOMPRESSION Right 11/29/2020   Procedure: SHOULDER ARTHROSCOPY DEBRIDEMENT  WITH SUPERIOR CAPSULAR RECONSTRUCION  ROTATOR CUFF REPAIR AND SUBACROMIAL DECOMPRESSION;  Surgeon: Hiram Gash, MD;  Location: [place];  Service: Orthopedics;  Laterality: Right;  . UPPER GI ENDOSCOPY  02/09/2017   Procedure: UPPER GI ENDOSCOPY;  Surgeon: Arta Bruce Kinsinger, MD;  [facility];  Service: General;;    There were no vitals filed for this visit.   Subjective Assessment - 02/19/21 1602    Subjective "Im fine"    Currently in Pain? Yes    Pain Score 1     Pain Location Shoulder    Pain Orientation Right                             [facility] Adult PT Treatment/Exercise - 02/19/21 0001      Shoulder Exercises: Supine   External Rotation Strengthening;Right;20 reps;Weights    External Rotation Weight (lbs) 2    Internal Rotation Right;Strengthening;20 reps;Weights    Internal Rotation Weight (lbs) 2      Shoulder Exercises: Standing   External Rotation Strengthening;Right;20 reps    Theraband Level (  Shoulder External Rotation) Level 2 (Red)    Flexion Strengthening;Both;20 reps;Weights    Shoulder Flexion Weight (lbs) 1    ABduction Strengthening;20 reps;Both;Weights    Shoulder ABduction Weight (lbs) 1    Extension Both;Strengthening;20 reps;Weights    Extension Weight (lbs) 10    Other Standing Exercises 4 level cabinet reaches 1lb flex & abd x10      Shoulder Exercises: ROM/Strengthening   UBE (Upper Arm Bike) L2 x3 each way    Other ROM/Strengthening Exercises Rows & Lats 25lb 2x10      Manual Therapy   Manual Therapy Passive ROM    Joint Mobilization R GH grades 3-4    Passive ROM R shoulder in all directions                     PT Short Term Goals - 01/24/21 1335      PT SHORT TERM GOAL #1   Title Pt will be I with initial HEP    Status Achieved             PT Long Term Goals - 02/19/21 1644      PT LONG TERM GOAL #1   Title Pt will demo R shoulder flexion/abduction WFL with no reports of increased R shoulder pain    Status On-going      PT LONG TERM GOAL #2   Title Pt will demo R shoulder MMT equivalent to L shoulder    Status On-going      PT LONG TERM GOAL #3   Title Pt will demo functional R shoulder IR/ER WNL    Status Partially Met      PT LONG TERM GOAL #4   Title Pt will report 50% reduction in R shoulder pain with activity    Status Partially Met                 Plan - 02/19/21 1645    Clinical Impression Statement Pt did well overall today with a progression in R shoulder strength. Visible R shoulder elevation noted with standing shoulder flexion and abduction. No reports of pain wit activity. Pt has difficulty with R shoulder internal rotation with shoulder abducted to 90.Pt responds well to scapular anchoring with flex in order to gain ROM.    Examination-Participation Restrictions Community Activity;Interpersonal Relationship    Stability/Clinical Decision Making Stable/Uncomplicated    Rehab Potential Good    PT Frequency 2x / week    PT Duration 12 weeks    PT Treatment/Interventions ADLs/Self Care Home Management;Electrical Stimulation;Cryotherapy;Neuromuscular re-education;Therapeutic exercise;Therapeutic activities;Patient/family education;Manual techniques;Passive range of motion;Vasopneumatic Device    PT Next Visit Plan Continue ROM and UE strengtheing as indicated           Patient will benefit from skilled therapeutic intervention in order to improve the following deficits and impairments:  Hypomobility,Increased edema,Decreased activity tolerance,Decreased strength,Decreased mobility,Impaired flexibility  Visit Diagnosis: Muscle weakness  (generalized)  Acute pain of right shoulder  Localized edema  Stiffness of right shoulder, not elsewhere classified     Problem List Patient Active Problem List   Diagnosis Date Noted  . Obesity 02/09/2017  . Screening for colon cancer   . Preoperative clearance 01/21/2017  . Obstructive sleep apnea 08/13/2009  . DYSPNEA 08/13/2009  . ALLERGY 08/13/2009    Scot Jun 02/19/2021, 4:49 PM  [address]. [place], [place], [address] Phone: [phone]   Fax:  [phone]  Name: Luz Burcher  [place] MRN: [id number] Date of Birth: [birth year]

## 2021-02-21 ENCOUNTER — Other Ambulatory Visit: Payer: Self-pay

## 2021-02-21 ENCOUNTER — Ambulatory Visit: Payer: No Typology Code available for payment source | Admitting: Physical Therapy

## 2021-02-21 ENCOUNTER — Encounter: Payer: No Typology Code available for payment source | Admitting: Physical Therapy

## 2021-02-21 ENCOUNTER — Encounter: Payer: Self-pay | Admitting: Physical Therapy

## 2021-02-21 DIAGNOSIS — R600 Localized edema: Secondary | ICD-10-CM

## 2021-02-21 DIAGNOSIS — M6281 Muscle weakness (generalized): Secondary | ICD-10-CM

## 2021-02-21 DIAGNOSIS — M25611 Stiffness of right shoulder, not elsewhere classified: Secondary | ICD-10-CM

## 2021-02-21 DIAGNOSIS — M25511 Pain in right shoulder: Secondary | ICD-10-CM

## 2021-02-21 NOTE — Patient Instructions (Signed)
Access Code: [facility] URL: [email or web address] Date: 02/21/2021 Prepared by: Debroah Baller  Exercises Shoulder Extension with Resistance - 1 x daily - 7 x weekly - 3 sets - 10 reps Squatting Shoulder Row with Anchored Resistance - 1 x daily - 7 x weekly - 3 sets - 10 reps Shoulder Abduction with Dumbbells - Thumbs Up - 1 x daily - 7 x weekly - 3 sets - 10 reps Standing Shoulder Flexion to 90 Degrees with Dumbbells - 1 x daily - 7 x weekly - 3 sets - 10 reps

## 2021-02-21 NOTE — Therapy (Signed)
[facility] [place], [place], [address] Phone: [phone]   Fax:  [phone]  Physical Therapy Treatment  Patient Details  Name: Jorge Strong MRN: [id number] Date of Birth: Jun 19, 1966 Referring Provider (PT): Colin Broach Date: 02/21/2021   PT End of Session - 02/21/21 1515    Visit Number 11    Date for PT Re-Evaluation 03/16/21    PT Start Time 1430    PT Stop Time 1515    PT Time Calculation (min) 45 min    Activity Tolerance Patient tolerated treatment well    Behavior During Therapy [facility] for tasks assessed/performed           Past Medical History:  Diagnosis Date  . Closed rib fracture    past hx, recent 3 days ago fell left side"? a new left rib fracture"-has not been confirmed.  . Enlarged prostate   . GERD (gastroesophageal reflux disease)   . H/O seasonal allergies   . Sleep apnea    hx of sleep apnea, no longer an issue after weight loss  . Wound of left leg    left leg wound is healing, but remains draining clear serous-covering over wound.    Past Surgical History:  Procedure Laterality Date  . APPENDECTOMY     age 83  . BICEPT TENODESIS Right 11/29/2020   Procedure: BICEPS TENODESIS;  Surgeon: Hiram Gash, MD;  Location: [place];  Service: Orthopedics;  Laterality: Right;  . COLONOSCOPY WITH PROPOFOL N/A 02/05/2017   Procedure: COLONOSCOPY WITH PROPOFOL;  Surgeon: Doran Stabler, MD;  Location: [facility];  Service: Gastroenterology;  Laterality: N/A;  . INGUINAL HERNIA REPAIR Right 11/02/2015   Procedure: LAPAROSCOPIC RIGHT INGUINAL HERNIA REPAIR WITH MESH;  Surgeon: Ralene Ok, MD;  Location: [facility];  Service: General;  Laterality: Right;  . INSERTION OF MESH Right 11/02/2015   Procedure: INSERTION OF MESH;  Surgeon: Ralene Ok, MD;  Location: [facility];  Service: General;  Laterality: Right;  . LAPAROSCOPIC GASTRIC SLEEVE RESECTION N/A 02/09/2017    Procedure: LAPAROSCOPIC GASTRIC SLEEVE RESECTION, UPPER ENDO;  Surgeon: Arta Bruce Kinsinger, MD;  Location: [facility];  Service: General;  Laterality: N/A;  . SHOULDER ARTHROSCOPY WITH ROTATOR CUFF REPAIR AND SUBACROMIAL DECOMPRESSION Right 11/29/2020   Procedure: SHOULDER ARTHROSCOPY DEBRIDEMENT  WITH SUPERIOR CAPSULAR RECONSTRUCION  ROTATOR CUFF REPAIR AND SUBACROMIAL DECOMPRESSION;  Surgeon: Hiram Gash, MD;  Location: [place];  Service: Orthopedics;  Laterality: Right;  . UPPER GI ENDOSCOPY  02/09/2017   Procedure: UPPER GI ENDOSCOPY;  Surgeon: Arta Bruce Kinsinger, MD;  Location: [facility];  Service: General;;    There were no vitals filed for this visit.   Subjective Assessment - 02/21/21 1438    Subjective "I am feeling good"    Currently in Pain? No/denies              [facility] PT Assessment - 02/21/21 0001      AROM   Right Shoulder Flexion 140 Degrees    Right Shoulder ABduction 129 Degrees    Right Shoulder Internal Rotation 60 Degrees    Right Shoulder External Rotation 86 Degrees                         [facility] Adult PT Treatment/Exercise - 02/21/21 0001      Shoulder Exercises: Standing   External Rotation Strengthening;Right;20 reps;Weights    External Rotation  Weight (lbs) 5    Internal Rotation Strengthening;Right;20 reps;Weights    Internal Rotation Weight (lbs) 5    Flexion Strengthening;Both;20 reps;Weights    Shoulder Flexion Weight (lbs) 2    ABduction Strengthening;20 reps;Both;Weights    Shoulder ABduction Weight (lbs) 2    Extension Theraband;Both;Strengthening;20 reps   x2   Row Theraband;Strengthening;Both;15 reps    Theraband Level (Shoulder Row) Level 4 (Blue)    Other Standing Exercises RUE ER and 90 deg 2x10    Other Standing Exercises tricep ext 35lb 2x15      Shoulder Exercises: ROM/Strengthening   UBE (Upper Arm Bike) L2 x3 each way    Other ROM/Strengthening Exercises Rows & Lats 35lb 2x10                     PT Short Term Goals - 01/24/21 1335      PT SHORT TERM GOAL #1   Title Pt will be I with initial HEP    Status Achieved             PT Long Term Goals - 02/19/21 1644      PT LONG TERM GOAL #1   Title Pt will demo R shoulder flexion/abduction WFL with no reports of increased R shoulder pain    Status On-going      PT LONG TERM GOAL #2   Title Pt will demo R shoulder MMT equivalent to L shoulder    Status On-going      PT LONG TERM GOAL #3   Title Pt will demo functional R shoulder IR/ER WNL    Status Partially Met      PT LONG TERM GOAL #4   Title Pt will report 50% reduction in R shoulder pain with activity    Status Partially Met                 Plan - 02/21/21 1515    Clinical Impression Statement Added additional strengthening interventions. Some difficulty noted with ER with RUE abducted to 90. R shoulder elevation remained with flexion and abduction. Flex and abd performed in front of mirror to help prevent elevation. Postural cues given with rows and ext. Updated Pt HEP.    Examination-Participation Restrictions Community Activity;Interpersonal Relationship    Stability/Clinical Decision Making Stable/Uncomplicated    Rehab Potential Good    PT Frequency 2x / week    PT Duration 12 weeks    PT Treatment/Interventions ADLs/Self Care Home Management;Electrical Stimulation;Cryotherapy;Neuromuscular re-education;Therapeutic exercise;Therapeutic activities;Patient/family education;Manual techniques;Passive range of motion;Vasopneumatic Device    PT Next Visit Plan Continue ROM and UE strengtheing as indicated, get report from MD           Patient will benefit from skilled therapeutic intervention in order to improve the following deficits and impairments:  Hypomobility,Increased edema,Decreased activity tolerance,Decreased strength,Decreased mobility,Impaired flexibility  Visit Diagnosis: Muscle weakness (generalized)  Acute pain of  right shoulder  Localized edema  Stiffness of right shoulder, not elsewhere classified     Problem List Patient Active Problem List   Diagnosis Date Noted  . Obesity 02/09/2017  . Screening for colon cancer   . Preoperative clearance 01/21/2017  . Obstructive sleep apnea 08/13/2009  . DYSPNEA 08/13/2009  . ALLERGY 08/13/2009    Scot Jun 02/21/2021, 3:18 PM  [address] [place], [place], [address] Phone: [phone]   Fax:  [phone]  Name: EVAAN TIDWELL MRN: [id number] Date of Birth: [birth year]

## 2021-02-25 ENCOUNTER — Ambulatory Visit: Payer: No Typology Code available for payment source | Admitting: Physical Therapy

## 2021-02-27 ENCOUNTER — Other Ambulatory Visit: Payer: Self-pay

## 2021-02-27 ENCOUNTER — Ambulatory Visit: Payer: No Typology Code available for payment source | Admitting: Physical Therapy

## 2021-02-27 DIAGNOSIS — R600 Localized edema: Secondary | ICD-10-CM | POA: Diagnosis not present

## 2021-02-27 DIAGNOSIS — M6281 Muscle weakness (generalized): Secondary | ICD-10-CM

## 2021-02-27 DIAGNOSIS — M25611 Stiffness of right shoulder, not elsewhere classified: Secondary | ICD-10-CM

## 2021-02-27 DIAGNOSIS — M25511 Pain in right shoulder: Secondary | ICD-10-CM

## 2021-02-27 NOTE — Therapy (Signed)
[facility]. [place], [place], [address] Phone: [phone]   Fax:  [phone]  Physical Therapy Treatment  Patient Details  Name: Jorge Strong MRN: [id number] Date of Birth: [birth year] Referring Provider (PT): Colin Broach Date: 02/27/2021   PT End of Session - 02/27/21 1339    Visit Number 12    Date for PT Re-Evaluation 03/16/21    PT Start Time 9628    PT Stop Time 1340    PT Time Calculation (min) 38 min           Past Medical History:  Diagnosis Date  . Closed rib fracture    past hx, recent 3 days ago fell left side"? a new left rib fracture"-has not been confirmed.  . Enlarged prostate   . GERD (gastroesophageal reflux disease)   . H/O seasonal allergies   . Sleep apnea    hx of sleep apnea, no longer an issue after weight loss  . Wound of left leg    left leg wound is healing, but remains draining clear serous-covering over wound.    Past Surgical History:  Procedure Laterality Date  . APPENDECTOMY     age 62  . BICEPT TENODESIS Right 11/29/2020   Procedure: BICEPS TENODESIS;  Surgeon: Hiram Gash, MD;  Location: [place];  Service: Orthopedics;  Laterality: Right;  . COLONOSCOPY WITH PROPOFOL N/A 02/05/2017   Procedure: COLONOSCOPY WITH PROPOFOL;  Surgeon: Doran Stabler, MD;  [facility];  Service: Gastroenterology;  Laterality: N/A;  . INGUINAL HERNIA REPAIR Right 11/02/2015   Procedure: LAPAROSCOPIC RIGHT INGUINAL HERNIA REPAIR WITH MESH;  Surgeon: Ralene Ok, MD;  [facility];  Service: General;  Laterality: Right;  . INSERTION OF MESH Right 11/02/2015   Procedure: INSERTION OF MESH;  Surgeon: Ralene Ok, MD;  Location[facility];  Service: General;  Laterality: Right;  . LAPAROSCOPIC GASTRIC SLEEVE RESECTION N/A 02/09/2017   Procedure: LAPAROSCOPIC GASTRIC SLEEVE RESECTION, UPPER ENDO;  Surgeon: Arta Bruce Kinsinger, MD;  Location[facility];   Service: General;  Laterality: N/A;  . SHOULDER ARTHROSCOPY WITH ROTATOR CUFF REPAIR AND SUBACROMIAL DECOMPRESSION Right 11/29/2020   Procedure: SHOULDER ARTHROSCOPY DEBRIDEMENT  WITH SUPERIOR CAPSULAR RECONSTRUCION  ROTATOR CUFF REPAIR AND SUBACROMIAL DECOMPRESSION;  Surgeon: Hiram Gash, MD;  Location: [place];  Service: Orthopedics;  Laterality: Right;  . UPPER GI ENDOSCOPY  02/09/2017   Procedure: UPPER GI ENDOSCOPY;  Surgeon: Arta Bruce Kinsinger, MD;  Location[facility];  Service: General;;    There were no vitals filed for this visit.   Subjective Assessment - 02/27/21 1305    Subjective Doing good. Pt reports that doctor said he was doing great and today will be hi slast session.              [facility] PT Assessment - 02/27/21 0001      AROM   AROM Assessment Site Shoulder    Right/Left Shoulder Right    Right Shoulder Flexion 141 Degrees    Right Shoulder ABduction 135 Degrees    Right Shoulder Internal Rotation 60 Degrees    Right Shoulder External Rotation 85 Degrees                         [facility] Adult PT Treatment/Exercise - 02/27/21 0001      Shoulder Exercises: Standing   External Rotation Right;20 reps    External  Rotation Weight (lbs) 5    Internal Rotation Strengthening;Right;20 reps    Internal Rotation Weight (lbs) 5    Flexion Strengthening;Both;20 reps    Shoulder Flexion Weight (lbs) 2    ABduction Strengthening;20 reps    Shoulder ABduction Weight (lbs) 2    Extension Both;20 reps    Extension Weight (lbs) 10# on pulleys    Row Both;20 reps    Row Weight (lbs) 10# on pulleys    Other Standing Exercises RUE ER and 90 deg 2x10    Other Standing Exercises 35# tricpe ext, 15# bicep  2x15 each      Shoulder Exercises: ROM/Strengthening   UBE (Upper Arm Bike) L2 x3 each way                    PT Short Term Goals - 01/24/21 1335      PT SHORT TERM GOAL #1   Title Pt will be I with initial HEP    Status Achieved              PT Long Term Goals - 02/27/21 1337      PT LONG TERM GOAL #1   Title Pt will demo R shoulder flexion/abduction WFL with no reports of increased R shoulder pain    Status On-going      PT LONG TERM GOAL #2   Title Pt will demo R shoulder MMT equivalent to L shoulder    Baseline R shoulder equal to L with flexion but not abduction.    Status Partially Met      PT LONG TERM GOAL #3   Title Pt will demo functional R shoulder IR/ER WNL    Status Partially Met      PT LONG TERM GOAL #4   Title Pt will report 50% reduction in R shoulder pain with activity    Status Partially Met                 Plan - 02/27/21 1340    Clinical Impression Statement Pt reported that his doctor was pleased with his progress and today would be his last session. Pt elected to continue his care at home. He tolderated all interventions well, with cueing needed for correct posture and to not elevate shoulder during standing flexion and abduction. Tactile cues needed to maintain full abduction to 90 during RUE ER, with difficulty noted. He continues to lack ROM in all shoulder motions and continues to show weakness with R abduction and ER.    PT Treatment/Interventions ADLs/Self Care Home Management;Electrical Stimulation;Cryotherapy;Neuromuscular re-education;Therapeutic exercise;Therapeutic activities;Patient/family education;Manual techniques;Passive range of motion;Vasopneumatic Device    PT Next Visit Plan DC Pt           Patient will benefit from skilled therapeutic intervention in order to improve the following deficits and impairments:  Hypomobility,Increased edema,Decreased activity tolerance,Decreased strength,Decreased mobility,Impaired flexibility  Visit Diagnosis: Muscle weakness (generalized)  Acute pain of right shoulder  Localized edema  Stiffness of right shoulder, not elsewhere classified     Problem List Patient Active Problem List   Diagnosis Date Noted  .  Obesity 02/09/2017  . Screening for colon cancer   . Preoperative clearance 01/21/2017  . Obstructive sleep apnea 08/13/2009  . DYSPNEA 08/13/2009  . ALLERGY 08/13/2009   PHYSICAL THERAPY DISCHARGE SUMMARY  Visits from Start of Care: 12   Plan: Patient agrees to discharge.  Patient goals were partially met. Patient is being discharged due to being pleased with the current functional  level.  ?????      Jorge Strong 02/27/2021, 2:21 PM  [address]. [place], [place], [address] Phone: [phone]   Fax:  [phone]  Name: Jorge Strong MRN: [id number] Date of Birth: 24-Feb-1966

## 2021-04-16 ENCOUNTER — Other Ambulatory Visit (HOSPITAL_COMMUNITY): Payer: Self-pay

## 2021-04-16 MED FILL — Tamsulosin HCl Cap 0.4 MG: ORAL | 90 days supply | Qty: 180 | Fill #0 | Status: AC

## 2021-05-09 ENCOUNTER — Other Ambulatory Visit (HOSPITAL_COMMUNITY): Payer: Self-pay

## 2021-05-09 MED FILL — Mometasone Furoate Nasal Susp 50 MCG/ACT: NASAL | 60 days supply | Qty: 34 | Fill #0 | Status: AC

## 2021-07-02 ENCOUNTER — Other Ambulatory Visit (HOSPITAL_COMMUNITY): Payer: Self-pay

## 2021-07-02 MED ORDER — TAMSULOSIN HCL 0.4 MG PO CAPS
ORAL_CAPSULE | ORAL | 3 refills | Status: AC
  Filled 2021-07-02 – 2021-07-25 (×2): qty 180, 90d supply, fill #0
  Filled 2021-11-07: qty 180, 90d supply, fill #1
  Filled 2022-02-25: qty 180, 90d supply, fill #2
  Filled 2022-06-03: qty 180, 90d supply, fill #3

## 2021-07-25 ENCOUNTER — Other Ambulatory Visit (HOSPITAL_COMMUNITY): Payer: Self-pay

## 2021-09-05 ENCOUNTER — Encounter (HOSPITAL_COMMUNITY): Payer: Self-pay | Admitting: *Deleted

## 2021-11-07 ENCOUNTER — Other Ambulatory Visit (HOSPITAL_COMMUNITY): Payer: Self-pay

## 2022-02-25 ENCOUNTER — Other Ambulatory Visit (HOSPITAL_COMMUNITY): Payer: Self-pay

## 2022-06-03 ENCOUNTER — Other Ambulatory Visit (HOSPITAL_COMMUNITY): Payer: Self-pay

## 2022-06-04 ENCOUNTER — Other Ambulatory Visit (HOSPITAL_COMMUNITY): Payer: Self-pay

## 2022-08-29 ENCOUNTER — Encounter (HOSPITAL_COMMUNITY): Payer: Self-pay | Admitting: *Deleted

## 2022-09-04 ENCOUNTER — Other Ambulatory Visit (HOSPITAL_COMMUNITY): Payer: Self-pay

## 2022-09-04 MED ORDER — TAMSULOSIN HCL 0.4 MG PO CAPS
0.8000 mg | ORAL_CAPSULE | Freq: Every evening | ORAL | 0 refills | Status: DC
Start: 2022-09-04 — End: 2022-10-13
  Filled 2022-09-04: qty 60, 30d supply, fill #0

## 2022-09-05 ENCOUNTER — Other Ambulatory Visit (HOSPITAL_BASED_OUTPATIENT_CLINIC_OR_DEPARTMENT_OTHER): Payer: Self-pay

## 2022-09-05 ENCOUNTER — Other Ambulatory Visit (HOSPITAL_COMMUNITY): Payer: Self-pay

## 2022-09-05 MED ORDER — AMOXICILLIN-POT CLAVULANATE 875-125 MG PO TABS
1.0000 | ORAL_TABLET | Freq: Two times a day (BID) | ORAL | 0 refills | Status: DC
  Filled 2022-09-05: qty 20, 10d supply, fill #0

## 2022-10-13 ENCOUNTER — Other Ambulatory Visit (HOSPITAL_COMMUNITY): Payer: Self-pay

## 2022-10-13 MED ORDER — TERBINAFINE HCL 250 MG PO TABS
250.0000 mg | ORAL_TABLET | Freq: Every day | ORAL | 0 refills | Status: AC
  Filled 2022-10-13: qty 90, 90d supply, fill #0

## 2022-10-13 MED ORDER — TAMSULOSIN HCL 0.4 MG PO CAPS
0.8000 mg | ORAL_CAPSULE | Freq: Every evening | ORAL | 3 refills | Status: AC
  Filled 2022-10-13: qty 180, 90d supply, fill #0
  Filled 2023-01-01: qty 180, 90d supply, fill #1
  Filled 2023-04-16: qty 180, 90d supply, fill #0
  Filled 2023-04-16: qty 180, 90d supply, fill #2
  Filled 2023-07-10 – 2023-07-16 (×2): qty 180, 90d supply, fill #1

## 2022-10-22 ENCOUNTER — Other Ambulatory Visit (HOSPITAL_BASED_OUTPATIENT_CLINIC_OR_DEPARTMENT_OTHER): Payer: Self-pay

## 2022-10-22 MED ORDER — DOXYCYCLINE HYCLATE 100 MG PO TABS
100.0000 mg | ORAL_TABLET | Freq: Two times a day (BID) | ORAL | 0 refills | Status: AC
  Filled 2022-10-22: qty 20, 10d supply, fill #0

## 2023-01-01 ENCOUNTER — Other Ambulatory Visit (HOSPITAL_BASED_OUTPATIENT_CLINIC_OR_DEPARTMENT_OTHER): Payer: Self-pay

## 2023-01-08 ENCOUNTER — Other Ambulatory Visit (HOSPITAL_BASED_OUTPATIENT_CLINIC_OR_DEPARTMENT_OTHER): Payer: Self-pay

## 2023-04-16 ENCOUNTER — Other Ambulatory Visit (HOSPITAL_COMMUNITY): Payer: Self-pay

## 2023-04-16 ENCOUNTER — Other Ambulatory Visit (HOSPITAL_BASED_OUTPATIENT_CLINIC_OR_DEPARTMENT_OTHER): Payer: Self-pay

## 2023-04-17 ENCOUNTER — Other Ambulatory Visit (HOSPITAL_COMMUNITY): Payer: Self-pay

## 2023-06-17 ENCOUNTER — Other Ambulatory Visit (HOSPITAL_BASED_OUTPATIENT_CLINIC_OR_DEPARTMENT_OTHER): Payer: Self-pay

## 2023-06-17 DIAGNOSIS — S0502XA Injury of conjunctiva and corneal abrasion without foreign body, left eye, initial encounter: Secondary | ICD-10-CM | POA: Diagnosis not present

## 2023-06-17 MED ORDER — ERYTHROMYCIN 5 MG/GM OP OINT
1.0000 | TOPICAL_OINTMENT | Freq: Four times a day (QID) | OPHTHALMIC | 0 refills | Status: AC
  Filled 2023-06-17: qty 3.5, 7d supply, fill #0

## 2023-07-10 ENCOUNTER — Other Ambulatory Visit: Payer: Self-pay

## 2023-07-16 ENCOUNTER — Other Ambulatory Visit: Payer: Self-pay

## 2023-07-16 ENCOUNTER — Other Ambulatory Visit (HOSPITAL_COMMUNITY): Payer: Self-pay

## 2023-07-18 ENCOUNTER — Other Ambulatory Visit (HOSPITAL_COMMUNITY): Payer: Self-pay

## 2023-07-20 ENCOUNTER — Other Ambulatory Visit (HOSPITAL_COMMUNITY): Payer: Self-pay

## 2023-09-03 ENCOUNTER — Encounter (HOSPITAL_COMMUNITY): Payer: Self-pay | Admitting: *Deleted

## 2023-09-19 ENCOUNTER — Emergency Department (HOSPITAL_BASED_OUTPATIENT_CLINIC_OR_DEPARTMENT_OTHER)
Admission: EM | Admit: 2023-09-19 | Discharge: 2023-09-19 | Disposition: A | Discharge status: Home / Self Care | Payer: 59 | Attending: Emergency Medicine | Admitting: Emergency Medicine

## 2023-09-19 ENCOUNTER — Emergency Department (HOSPITAL_BASED_OUTPATIENT_CLINIC_OR_DEPARTMENT_OTHER): Payer: 59

## 2023-09-19 ENCOUNTER — Other Ambulatory Visit: Payer: Self-pay

## 2023-09-19 ENCOUNTER — Encounter (HOSPITAL_BASED_OUTPATIENT_CLINIC_OR_DEPARTMENT_OTHER): Payer: Self-pay

## 2023-09-19 DIAGNOSIS — S81801A Unspecified open wound, right lower leg, initial encounter: Secondary | ICD-10-CM | POA: Insufficient documentation

## 2023-09-19 DIAGNOSIS — S81851A Open bite, right lower leg, initial encounter: Secondary | ICD-10-CM | POA: Diagnosis not present

## 2023-09-19 DIAGNOSIS — T148XXA Other injury of unspecified body region, initial encounter: Secondary | ICD-10-CM

## 2023-09-19 DIAGNOSIS — S81831A Puncture wound without foreign body, right lower leg, initial encounter: Secondary | ICD-10-CM | POA: Diagnosis not present

## 2023-09-19 DIAGNOSIS — L03115 Cellulitis of right lower limb: Secondary | ICD-10-CM | POA: Diagnosis not present

## 2023-09-19 DIAGNOSIS — W540XXA Bitten by dog, initial encounter: Secondary | ICD-10-CM | POA: Insufficient documentation

## 2023-09-19 DIAGNOSIS — S81819A Laceration without foreign body, unspecified lower leg, initial encounter: Secondary | ICD-10-CM | POA: Diagnosis not present

## 2023-09-19 DIAGNOSIS — S8991XA Unspecified injury of right lower leg, initial encounter: Secondary | ICD-10-CM | POA: Diagnosis present

## 2023-09-19 DIAGNOSIS — I96 Gangrene, not elsewhere classified: Secondary | ICD-10-CM | POA: Diagnosis not present

## 2023-09-19 LAB — CBC WITH DIFFERENTIAL/PLATELET
Abs Immature Granulocytes: 0.01 10*3/uL (ref 0.00–0.07)
Basophils Absolute: 0 10*3/uL (ref 0.0–0.1)
Basophils Relative: 0 %
Eosinophils Absolute: 0.1 10*3/uL (ref 0.0–0.5)
Eosinophils Relative: 2 %
HCT: 40.2 % (ref 39.0–52.0)
Hemoglobin: 13.1 g/dL (ref 13.0–17.0)
Immature Granulocytes: 0 %
Lymphocytes Relative: 21 %
Lymphs Abs: 1.2 10*3/uL (ref 0.7–4.0)
MCH: 30.2 pg (ref 26.0–34.0)
MCHC: 32.6 g/dL (ref 30.0–36.0)
MCV: 92.6 fL (ref 80.0–100.0)
Monocytes Absolute: 0.6 10*3/uL (ref 0.1–1.0)
Monocytes Relative: 11 %
Neutro Abs: 3.8 10*3/uL (ref 1.7–7.7)
Neutrophils Relative %: 66 %
Platelets: 217 10*3/uL (ref 150–400)
RBC: 4.34 MIL/uL (ref 4.22–5.81)
RDW: 12.8 % (ref 11.5–15.5)
WBC: 5.8 10*3/uL (ref 4.0–10.5)
nRBC: 0 % (ref 0.0–0.2)

## 2023-09-19 LAB — BASIC METABOLIC PANEL
Anion gap: 7 (ref 5–15)
BUN: 16 mg/dL (ref 6–20)
CO2: 27 mmol/L (ref 22–32)
Calcium: 8.5 mg/dL — ABNORMAL LOW (ref 8.9–10.3)
Chloride: 104 mmol/L (ref 98–111)
Creatinine, Ser: 0.94 mg/dL (ref 0.61–1.24)
GFR, Estimated: >60 mL/min (ref >60)
Glucose, Bld: 127 mg/dL — ABNORMAL HIGH (ref 70–99)
Potassium: 4.3 mmol/L (ref 3.5–5.1)
Sodium: 138 mmol/L (ref 135–145)

## 2023-09-19 MED ORDER — CEFUROXIME AXETIL 500 MG PO TABS: 500.0000 mg | ORAL_TABLET | Freq: Two times a day (BID) | ORAL | 0 refills | Status: DC

## 2023-09-19 MED ORDER — METRONIDAZOLE 500 MG/100ML IV SOLN
500.0000 mg | Freq: Once | INTRAVENOUS | Status: AC
  Administered 2023-09-19: 500 mg via INTRAVENOUS
  Filled 2023-09-19: qty 100

## 2023-09-19 MED ORDER — METRONIDAZOLE 500 MG PO TABS
500.0000 mg | ORAL_TABLET | Freq: Two times a day (BID) | ORAL | 0 refills | Status: DC
Start: 2023-09-19 — End: 2023-09-21

## 2023-09-19 MED ORDER — SODIUM CHLORIDE 0.9 % IV SOLN
1.0000 g | Freq: Once | INTRAVENOUS | Status: AC
  Administered 2023-09-19: 1 g via INTRAVENOUS
  Filled 2023-09-19: qty 10

## 2023-09-19 NOTE — ED Notes (Signed)
Animal bite noted to RLE, lateral side of lower right leg. Redness noted along with yellow/green drainage from wound.

## 2023-09-19 NOTE — Discharge Instructions (Signed)
You were seen in the department today with a wound infection from a dog bite.  I am starting this on 2 antibiotics.  Please take these as directed.  I have also placed a referral for you to be seen at the wound care center.  Please call their office on Monday to schedule this appointment.  I would like for you to coordinate closely with your primary care doctor to also be following this wound and help in the healing process.  If you develop fever, sudden worsening redness/drainage to the wound you should return to the emergency department for reevaluation.

## 2023-09-19 NOTE — ED Notes (Signed)
Discharge paperwork given and verbally understood. 

## 2023-09-19 NOTE — ED Provider Notes (Signed)
Emergency Department Provider Note   I have reviewed the triage vital signs and the nursing notes.   HISTORY  Chief Complaint Animal Bite   HPI Jorge Strong is a 57 y.o. male past history reviewed below presents emergency department with a right leg wound from a dog bite 1 week ago.  Patient was on a kayak/camping trip with a friend who brought her dog.  He states while unpacking from the trip the dog bit him in the leg.  The dog is up-to-date on vaccines including rabies.  He was camping in a remote place and so wrapped the leg and cared for it as best he could but was unable to seek medical help or wash it thoroughly.  When he returned home he washed it but noticed that some of the wound had become more erythematous and some dark discoloration has developed.  No fevers.  He went to [facility] walk-in urgent care today and was redirected to the emergency department.  He denies any rapid spreading of redness or streaking up the leg.  No chills.   Past Medical History:  Diagnosis Date   Closed rib fracture    past hx, recent 3 days ago fell left side"? a new left rib fracture"-has not been confirmed.   Enlarged prostate    GERD (gastroesophageal reflux disease)    H/O seasonal allergies    Sleep apnea    hx of sleep apnea, no longer an issue after weight loss   Wound of left leg    left leg wound is healing, but remains draining clear serous-covering over wound.    Review of Systems  Constitutional: No fever/chills Cardiovascular: Denies chest pain. Respiratory: Denies shortness of breath. Gastrointestinal: No abdominal pain.  No nausea, no vomiting.  Skin: Dog bite to leg.   ____________________________________________   PHYSICAL EXAM:  VITAL SIGNS: ED Triage Vitals  Encounter Vitals Group     BP 09/19/23 1015 (!) 143/72     Pulse Rate 09/19/23 1015 77     Resp 09/19/23 1015 18     Temp 09/19/23 1015 97.6 F (36.4 C)     Temp Source 09/19/23 1015 Oral     SpO2  09/19/23 1015 98 %     Weight 09/19/23 1018 280 lb 6.8 oz (127.2 kg)     Height 09/19/23 1018 6\' 5"  (1.956 m)   Constitutional: Alert and oriented. Well appearing and in no acute distress. Eyes: Conjunctivae are normal.  Head: Atraumatic. Nose: No congestion/rhinnorhea. Mouth/Throat: Mucous membranes are moist.  Neck: No stridor.  Cardiovascular: Good peripheral circulation.  Respiratory: Normal respiratory effort. Gastrointestinal: No distention.  Musculoskeletal: Bilateral lower extremity swelling (baseline).  Compartments are soft bilaterally.  2+ pedal pulses.  Neurologic:  Normal speech and language. Skin:  Skin is warm and dry.  Bite wound to the right lower leg as pictured.  No crepitus.    ____________________________________________   LABS (all labs ordered are listed, but only abnormal results are displayed)  Labs Reviewed  BASIC METABOLIC PANEL - Abnormal; Notable for the following components:      Result Value   Glucose, Bld 127 (*)    Calcium 8.5 (*)    All other components within normal limits  CBC WITH DIFFERENTIAL/PLATELET   ____________________________________________  RADIOLOGY  DG Tibia/Fibula Right  Result Date: 09/19/2023 CLINICAL DATA:  Dog bite to the lower leg 1 week ago. EXAM: RIGHT TIBIA AND FIBULA - 2 VIEW COMPARISON:  Tibia and fibula radiographs dated 04/08/2014.  FINDINGS: There is no evidence of fracture or other focal bone lesions. There is a laceration with surrounding soft tissue swelling of the anterior leg. No radiopaque foreign body. IMPRESSION: Laceration with surrounding soft tissue swelling of the anterior leg. No radiopaque foreign body or acute osseous injury. Electronically Signed   By: Romona Curls M.D.   On: 09/19/2023 11:07    ____________________________________________   PROCEDURES  Procedure(s) performed:   Procedures  None  ____________________________________________   INITIAL IMPRESSION / ASSESSMENT AND PLAN / ED  COURSE  Pertinent labs & imaging results that were available during my care of the patient were reviewed by me and considered in my medical decision making (see chart for details).   This patient is Presenting for Evaluation of leg wound, which does require a range of treatment options, and is a complaint that involves a moderate and high risk of morbidity and mortality.  The Differential Diagnoses include cellulitis, abscess, fungal infection, fascitis, etc.  Clinical Laboratory Tests Ordered, included CBC without leukocytosis. BMP without AKI.   Radiologic Tests Ordered, included right tib/fib XR. I independently interpreted the images and agree with radiology interpretation.   Medical Decision Making: Summary:  Patient presents emergency department 1 week after dog bite to the right leg now with wound noted.  Afebrile.  No SIRS vitals.  Doubt sepsis.  The dog is known and up-to-date on rabies vaccine.  Patient has not been on antibiotics to this point.  Will obtain x-ray of the right leg along with labs and reassess.  Reevaluation with update and discussion with patient. Plan for abx and wound care referral. Discussed strict ED return precautions.   Patient's presentation is most consistent with acute presentation with potential threat to life or bodily function.   Disposition: discharge  ____________________________________________  FINAL CLINICAL IMPRESSION(S) / ED DIAGNOSES  Final diagnoses:  Animal bite  Cellulitis of right lower extremity     NEW OUTPATIENT MEDICATIONS STARTED DURING THIS VISIT:  Discharge Medication List as of 09/19/2023  1:05 PM     START taking these medications   Details  cefUROXime (CEFTIN) 500 MG tablet Take 1 tablet (500 mg total) by mouth 2 (two) times daily with a meal for 7 days., Starting Sat 09/19/2023, Until Sat 09/26/2023, Normal    metroNIDAZOLE (FLAGYL) 500 MG tablet Take 1 tablet (500 mg total) by mouth 2 (two) times daily for 7 days.,  Starting Sat 09/19/2023, Until Sat 09/26/2023, Normal        Note:  This document was prepared using Dragon voice recognition software and may include unintentional dictation errors.  Alona Bene, MD, [facility] Emergency Medicine    Bedford Winsor, Arlyss Repress, MD 09/20/23 [phone]

## 2023-09-19 NOTE — ED Triage Notes (Addendum)
Patient ambulated to room reporting bite to RLE from known dog on 09/11/23 while on a camping trip. Patient reports he believes the dog became spooked and that is why she bit him. Patient reports the dog is a mix of a dachshund and something else. Per patient, the dog is up to date on all vaccines.

## 2023-09-21 ENCOUNTER — Telehealth (HOSPITAL_BASED_OUTPATIENT_CLINIC_OR_DEPARTMENT_OTHER): Payer: Self-pay

## 2023-09-21 ENCOUNTER — Telehealth (HOSPITAL_BASED_OUTPATIENT_CLINIC_OR_DEPARTMENT_OTHER): Payer: Self-pay | Admitting: Emergency Medicine

## 2023-09-21 MED ORDER — ONDANSETRON 4 MG PO TBDP: 4.0000 mg | ORAL_TABLET | Freq: Three times a day (TID) | ORAL | 0 refills | Status: AC | PRN

## 2023-09-21 MED ORDER — AMOXICILLIN-POT CLAVULANATE 875-125 MG PO TABS: 1.0000 | ORAL_TABLET | Freq: Two times a day (BID) | ORAL | 0 refills | Status: AC

## 2023-09-21 NOTE — Telephone Encounter (Signed)
Patient having nausea and vomiting with Flagyl.  Will prescribe Augmentin.Marland Kitchen  Also prescribe Zofran for nausea.

## 2023-09-21 NOTE — Telephone Encounter (Signed)
Received call from patients wife that the Flagyl is causing diarrhea, vomiting and his head to "feel weird". Requesting change to antibiotic. Patients wife denies him using any alcohol. Dr. Lockie Mola d/c current abx prescriptions and sending Augmentin to pharmacy. Wife understands new instructions, no further questions.

## 2023-10-23 ENCOUNTER — Other Ambulatory Visit (HOSPITAL_BASED_OUTPATIENT_CLINIC_OR_DEPARTMENT_OTHER): Payer: Self-pay

## 2023-10-23 MED ORDER — TAMSULOSIN HCL 0.4 MG PO CAPS
0.8000 mg | ORAL_CAPSULE | Freq: Every day | ORAL | 0 refills | Status: DC
  Filled 2023-10-23: qty 180, 90d supply, fill #0

## 2023-11-14 DIAGNOSIS — J0100 Acute maxillary sinusitis, unspecified: Secondary | ICD-10-CM | POA: Diagnosis not present

## 2024-01-12 ENCOUNTER — Other Ambulatory Visit (HOSPITAL_COMMUNITY): Payer: Self-pay

## 2024-01-12 MED ORDER — TAMSULOSIN HCL 0.4 MG PO CAPS
0.8000 mg | ORAL_CAPSULE | Freq: Every day | ORAL | 1 refills | Status: DC
  Filled 2024-01-12 – 2024-01-19 (×2): qty 180, 90d supply, fill #0
  Filled 2024-04-15: qty 180, 90d supply, fill #1

## 2024-01-19 ENCOUNTER — Other Ambulatory Visit (HOSPITAL_COMMUNITY): Payer: Self-pay

## 2024-01-26 DIAGNOSIS — B974 Respiratory syncytial virus as the cause of diseases classified elsewhere: Secondary | ICD-10-CM | POA: Diagnosis not present

## 2024-01-26 DIAGNOSIS — J069 Acute upper respiratory infection, unspecified: Secondary | ICD-10-CM | POA: Diagnosis not present

## 2024-01-26 DIAGNOSIS — Z03818 Encounter for observation for suspected exposure to other biological agents ruled out: Secondary | ICD-10-CM | POA: Diagnosis not present

## 2024-01-26 DIAGNOSIS — R051 Acute cough: Secondary | ICD-10-CM | POA: Diagnosis not present

## 2024-01-26 DIAGNOSIS — J00 Acute nasopharyngitis [common cold]: Secondary | ICD-10-CM | POA: Diagnosis not present

## 2024-01-26 DIAGNOSIS — R509 Fever, unspecified: Secondary | ICD-10-CM | POA: Diagnosis not present

## 2024-01-29 ENCOUNTER — Other Ambulatory Visit (HOSPITAL_BASED_OUTPATIENT_CLINIC_OR_DEPARTMENT_OTHER): Payer: Self-pay

## 2024-01-29 MED ORDER — BENZONATATE 200 MG PO CAPS
200.0000 mg | ORAL_CAPSULE | Freq: Three times a day (TID) | ORAL | 0 refills | Status: AC
  Filled 2024-01-29: qty 15, 5d supply, fill #0

## 2024-02-08 DIAGNOSIS — J309 Allergic rhinitis, unspecified: Secondary | ICD-10-CM | POA: Diagnosis not present

## 2024-02-08 DIAGNOSIS — N401 Enlarged prostate with lower urinary tract symptoms: Secondary | ICD-10-CM | POA: Diagnosis not present

## 2024-02-08 DIAGNOSIS — N529 Male erectile dysfunction, unspecified: Secondary | ICD-10-CM | POA: Diagnosis not present

## 2024-02-08 DIAGNOSIS — Z125 Encounter for screening for malignant neoplasm of prostate: Secondary | ICD-10-CM | POA: Diagnosis not present

## 2024-02-08 DIAGNOSIS — Z0000 Encounter for general adult medical examination without abnormal findings: Secondary | ICD-10-CM | POA: Diagnosis not present

## 2024-03-09 ENCOUNTER — Other Ambulatory Visit (HOSPITAL_BASED_OUTPATIENT_CLINIC_OR_DEPARTMENT_OTHER): Payer: Self-pay

## 2024-03-09 DIAGNOSIS — N451 Epididymitis: Secondary | ICD-10-CM | POA: Diagnosis not present

## 2024-03-09 MED ORDER — DOXYCYCLINE HYCLATE 100 MG PO CAPS
100.0000 mg | ORAL_CAPSULE | Freq: Two times a day (BID) | ORAL | 0 refills | Status: AC
  Filled 2024-03-09: qty 14, 7d supply, fill #0

## 2024-04-15 ENCOUNTER — Other Ambulatory Visit (HOSPITAL_COMMUNITY): Payer: Self-pay

## 2024-07-13 ENCOUNTER — Other Ambulatory Visit (HOSPITAL_COMMUNITY): Payer: Self-pay

## 2024-07-14 ENCOUNTER — Other Ambulatory Visit (HOSPITAL_COMMUNITY): Payer: Self-pay

## 2024-07-14 MED ORDER — TAMSULOSIN HCL 0.4 MG PO CAPS
0.8000 mg | ORAL_CAPSULE | Freq: Every day | ORAL | 0 refills | Status: AC
  Filled 2024-07-14: qty 180, 90d supply, fill #0

## 2024-07-15 ENCOUNTER — Other Ambulatory Visit: Payer: Self-pay

## 2024-08-29 ENCOUNTER — Other Ambulatory Visit (HOSPITAL_BASED_OUTPATIENT_CLINIC_OR_DEPARTMENT_OTHER): Payer: Self-pay

## 2024-08-29 MED ORDER — FLUZONE 0.5 ML IM SUSY
0.5000 mL | PREFILLED_SYRINGE | Freq: Once | INTRAMUSCULAR | 0 refills | Status: AC
  Filled 2024-08-29: qty 0.5, 1d supply, fill #0

## 2024-09-06 ENCOUNTER — Encounter (HOSPITAL_COMMUNITY): Payer: Self-pay | Admitting: *Deleted

## 2024-09-09 ENCOUNTER — Other Ambulatory Visit (HOSPITAL_BASED_OUTPATIENT_CLINIC_OR_DEPARTMENT_OTHER): Payer: Self-pay

## 2024-09-09 MED ORDER — FINASTERIDE 5 MG PO TABS
5.0000 mg | ORAL_TABLET | Freq: Every day | ORAL | 5 refills | Status: AC
  Filled 2024-09-09: qty 30, 30d supply, fill #0
  Filled 2024-10-05: qty 30, 30d supply, fill #1
  Filled 2024-10-31: qty 30, 30d supply, fill #2
  Filled 2024-12-07: qty 90, 90d supply, fill #3

## 2024-10-07 ENCOUNTER — Other Ambulatory Visit (HOSPITAL_BASED_OUTPATIENT_CLINIC_OR_DEPARTMENT_OTHER): Payer: Self-pay

## 2024-10-31 ENCOUNTER — Other Ambulatory Visit (HOSPITAL_BASED_OUTPATIENT_CLINIC_OR_DEPARTMENT_OTHER): Payer: Self-pay

## 2024-11-02 ENCOUNTER — Other Ambulatory Visit (HOSPITAL_BASED_OUTPATIENT_CLINIC_OR_DEPARTMENT_OTHER): Payer: Self-pay

## 2024-11-02 MED ORDER — TAMSULOSIN HCL 0.4 MG PO CAPS
0.8000 mg | ORAL_CAPSULE | Freq: Every day | ORAL | 1 refills | Status: AC
  Filled 2024-11-02: qty 180, 90d supply, fill #0

## 2024-11-04 ENCOUNTER — Other Ambulatory Visit (HOSPITAL_BASED_OUTPATIENT_CLINIC_OR_DEPARTMENT_OTHER): Payer: Self-pay

## 2024-11-14 ENCOUNTER — Other Ambulatory Visit (HOSPITAL_BASED_OUTPATIENT_CLINIC_OR_DEPARTMENT_OTHER): Payer: Self-pay

## 2024-12-07 ENCOUNTER — Other Ambulatory Visit (HOSPITAL_COMMUNITY): Payer: Self-pay
